# Patient Record
Sex: Male | Born: 1940 | Race: White | Hispanic: No | Marital: Married
Health system: Southern US, Academic
[De-identification: ages and names within clinical notes are randomized; demographics above are authoritative.]

---

## 2007-04-16 ENCOUNTER — Ambulatory Visit (INDEPENDENT_AMBULATORY_CARE_PROVIDER_SITE_OTHER): Payer: BC Managed Care – PPO | Admitting: NEUROSURGERY

## 2007-05-16 ENCOUNTER — Encounter (INDEPENDENT_AMBULATORY_CARE_PROVIDER_SITE_OTHER): Payer: BC Managed Care – PPO | Admitting: NEUROSURGERY

## 2007-05-18 ENCOUNTER — Encounter (INDEPENDENT_AMBULATORY_CARE_PROVIDER_SITE_OTHER): Payer: BC Managed Care – PPO | Admitting: NEUROSURGERY

## 2007-06-27 ENCOUNTER — Encounter (INDEPENDENT_AMBULATORY_CARE_PROVIDER_SITE_OTHER): Payer: BC Managed Care – PPO | Admitting: NEUROSURGERY

## 2007-06-29 ENCOUNTER — Encounter (INDEPENDENT_AMBULATORY_CARE_PROVIDER_SITE_OTHER): Payer: BC Managed Care – PPO | Admitting: NEUROSURGERY

## 2007-07-11 ENCOUNTER — Encounter (INDEPENDENT_AMBULATORY_CARE_PROVIDER_SITE_OTHER): Payer: BC Managed Care – PPO | Admitting: NEUROSURGERY

## 2011-04-27 ENCOUNTER — Ambulatory Visit (HOSPITAL_COMMUNITY): Payer: Self-pay | Admitting: FAMILY PRACTICE

## 2018-12-14 IMAGING — CT CT CHEST WITHOUT CONTRAST
2 of 3 series · 15 of 36 positions shown, 18 images · non-contrast
Comparison: There are no previous exams available for comparison.

CT CHEST WITHOUT CONTRAST, 12/14/2018 [DATE]: 
CLINICAL INDICATION: Dyspnea, coughing and shortness of breath x1 month 
A search for DICOM formatted images was conducted for prior CT imaging studies 
completed at a non-affiliated media free facility.
TECHNIQUE: The chest was scanned from base of neck through the lung bases 
without contrast on a high resolution low dose CT scanner.  Routine MPR and MIP 
3D renderings were reconstructed on an independent workstation with concurrent 
physician supervision.

[Series 3: chest 2.0 i31s 3 · axial · 0.88mm/px · z∈[-411,-67]mm · 12 of 202 slices shown, 15 images]
[im 15/202  mediastinal]
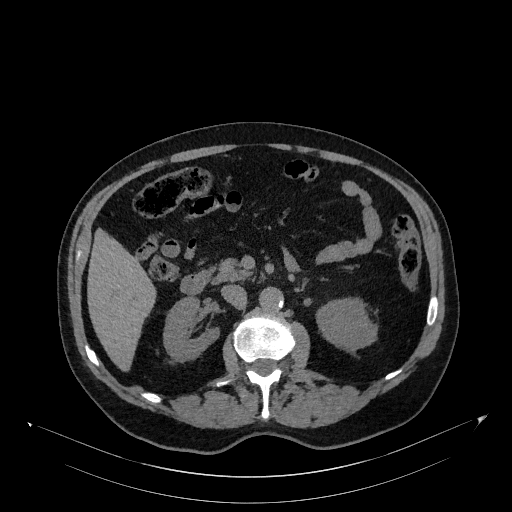
[im 15/202  lung]
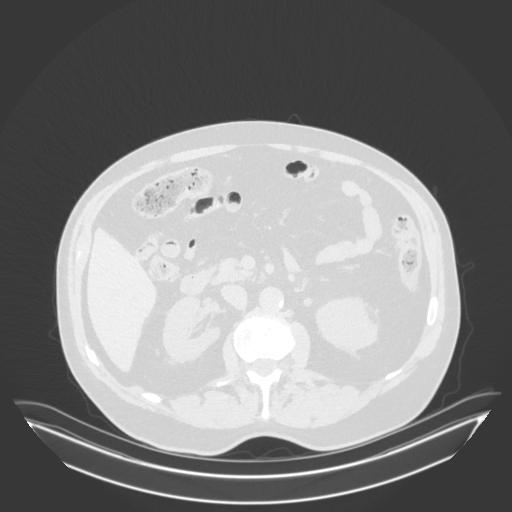
[im 30/202  lung]
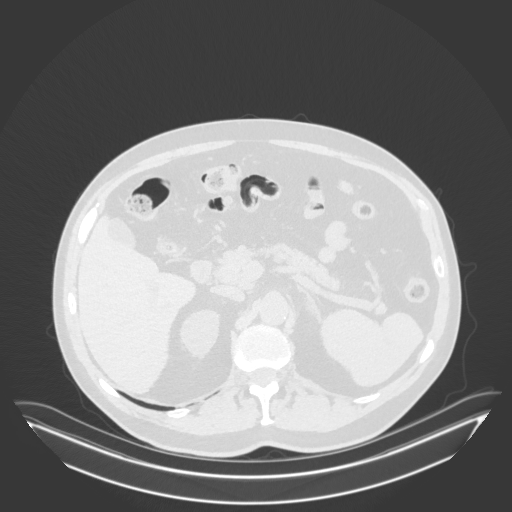
[im 45/202  lung]
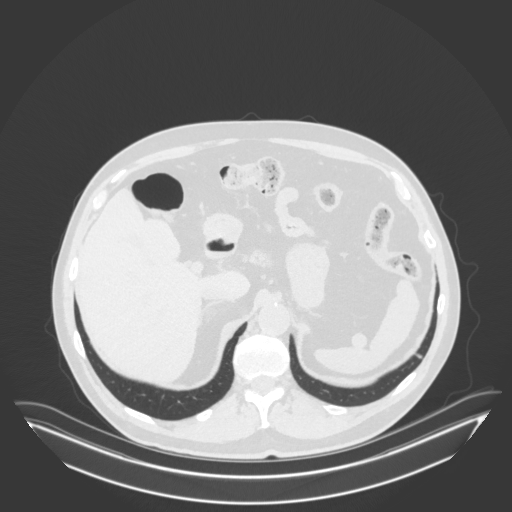
[im 60/202  lung]
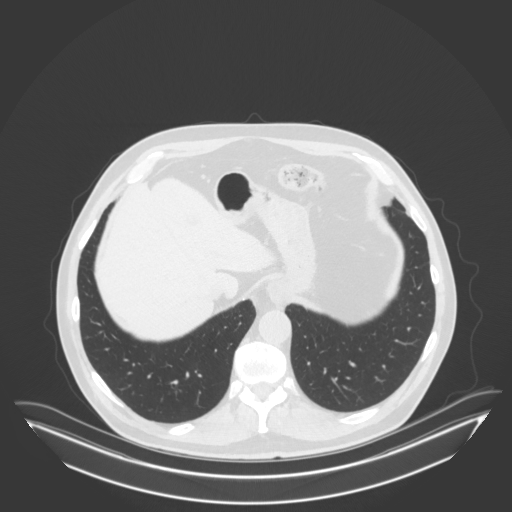
[im 75/202  mediastinal]
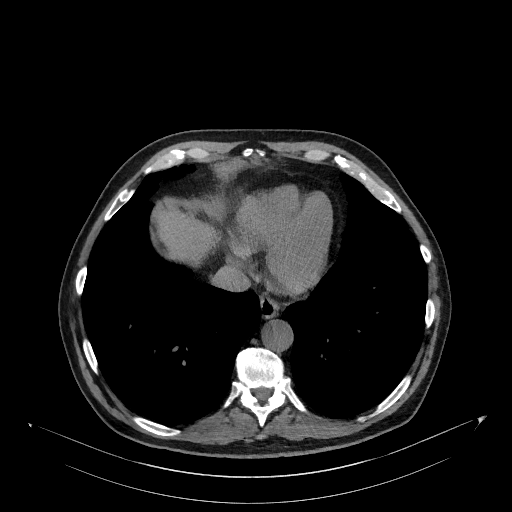
[im 75/202  lung]
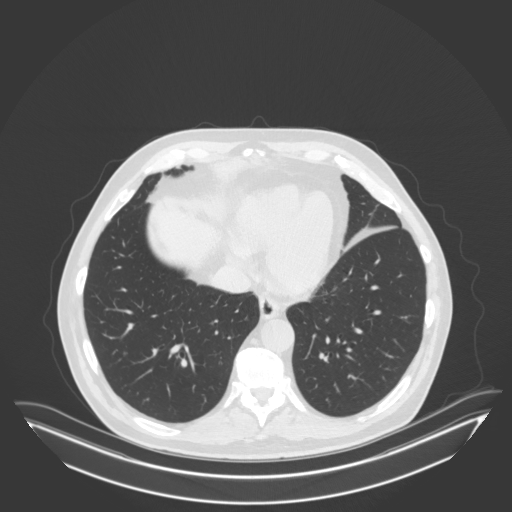
[im 90/202  lung]
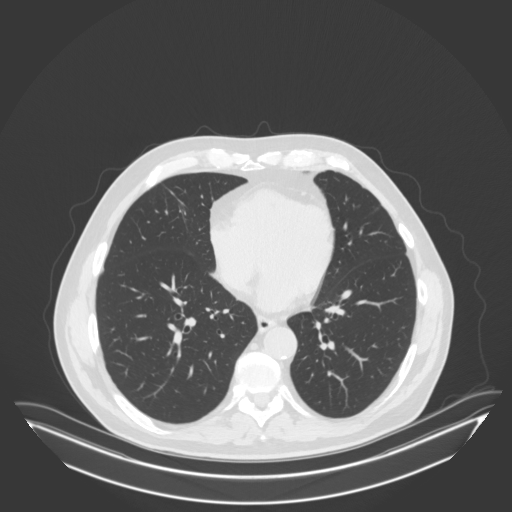
[im 112/202  lung]
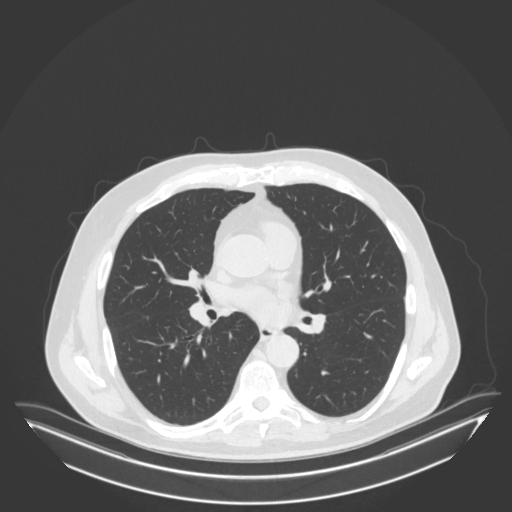
[im 127/202  lung]
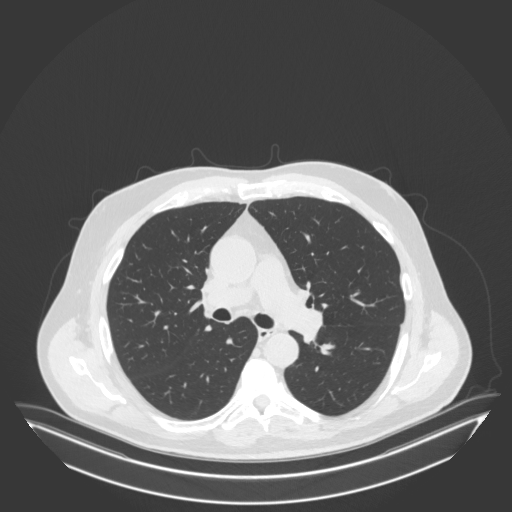
[im 142/202  mediastinal]
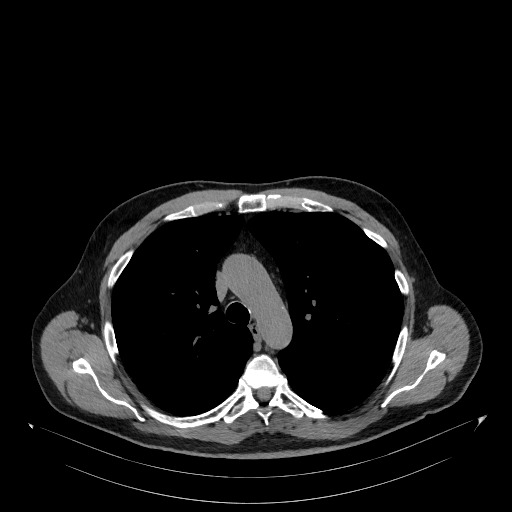
[im 142/202  lung]
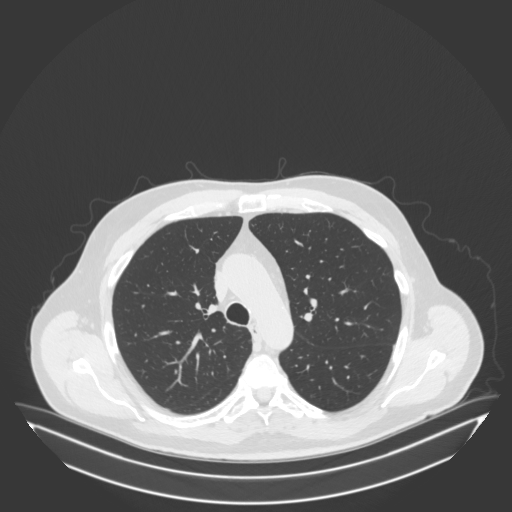
[im 157/202  lung]
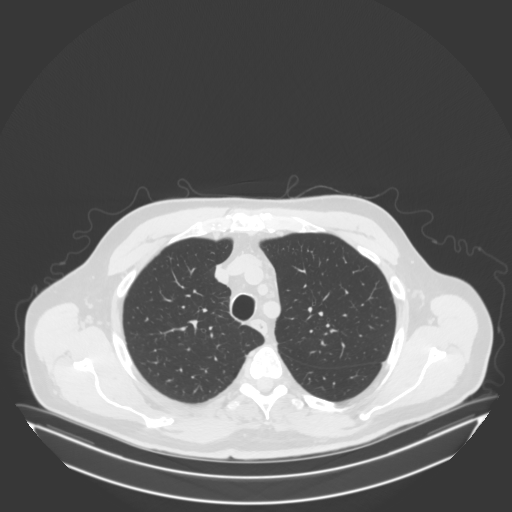
[im 172/202  lung]
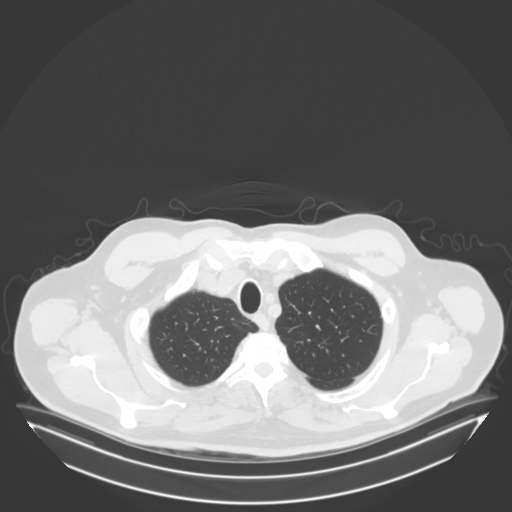
[im 187/202  lung]
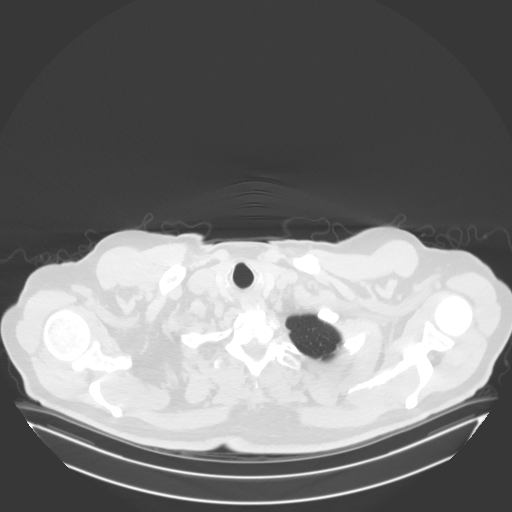

[Series 6: coronal · coronal · 0.76mm/px · 3 of 142 slices shown]
[im 29/142  lung]
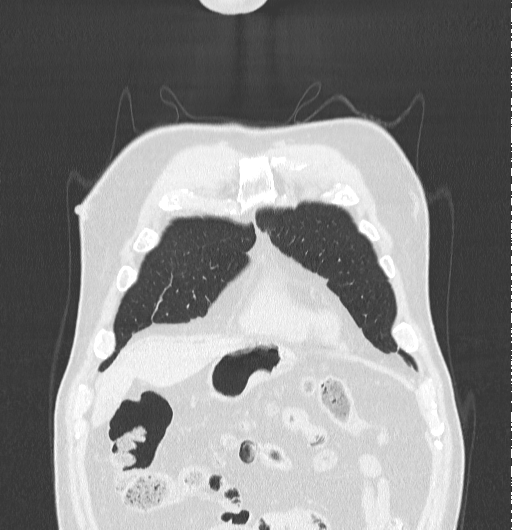
[im 57/142  lung]
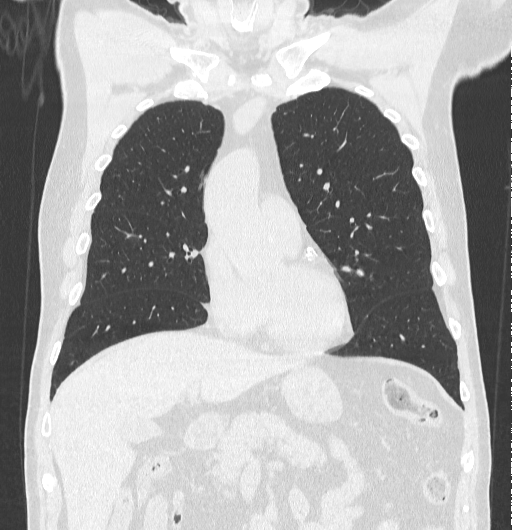
[im 85/142  lung]
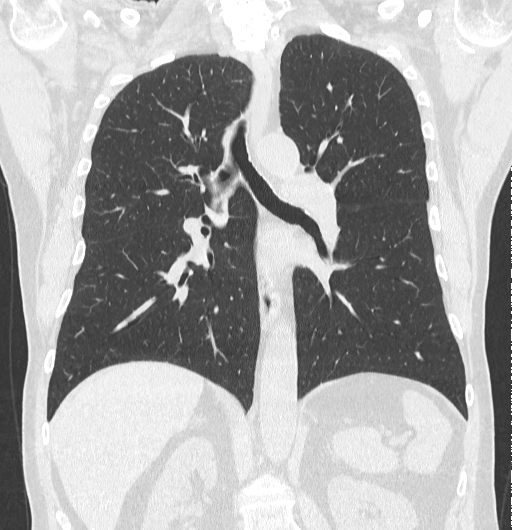

[15 of 36 positions shown; findings below may reference images not displayed]

FINDINGS: Lungs are well-expanded. There is no infiltrate or pleural effusion. 
There is a somewhat triangular 3 x 2 mm nodule in the right lower lobe, image 
119. There is a 1 mm nodule at the right base, image 124. A 1 mm pleural-based 
nodule is seen on image 145. A 2 mm nodule is seen in the lingula, image 107 
Thyroid lobes are unremarkable. There is no mediastinal adenopathy. The main 
pulmonary artery is nondilated. There is no axillary adenopathy. The heart is 
not enlarged. There are moderate coronary artery calcifications. There is a 
small hiatal hernia. No adrenal nodule. There are calcified splenic granulomas. 
Liver is unremarkable. There are mild thoracic spine degenerative changes.
IMPRESSION: Micronodules at the lung bases as described, the largest triangular, 3 x 2 mm in 
the right lower lobe. 
No adenopathy, infiltrate or pleural effusion. 
Small hiatal hernia, potentially associated with GERD. 
REFERENCE: 
Recommendations for pulmonary nodule follow-up according to [HOSPITAL] 
Guidelines. 
Multiple nodules size: < 6mm 
*Low risk patients: no routine follow-up. 
*High risk patients: optional CT at 12 months. 
Multiple nodules size: 6-8mm 
*Low risk patients: follow-up at 3-6 months, then consider further follow-up at 
18-24 months. 
*High risk patients: follow-up at 3-6 months, then at 18-24 months if no change. 
Multiple nodules size: > 8mm 
*Low risk patients: follow-up at 3-6 months, then consider further follow-up at 
18-24 months. 
*High risk patients: follow-up at 3-6 months, then at 18-24 months if no change. 
NOTE: 
Low risk patients: minimal or absent history of smoking and or other known risk 
factors. 
High risk patients: history of smoking or of other known risk factors (e.g. 
first degree relative with lung cancer, or exposure to asbestos, radon uranium) 
If a nodule up to 8mm is partly solid or is ground glass further follow up is 
required after 24 months to exclude possible slow growing adenocarcinoma (GUIN) 
Size is average of length and width. 
RADIATION DOSE REDUCTION: All CT scans are performed using radiation dose 
reduction techniques, when applicable.  Technical factors are evaluated and 
adjusted to ensure appropriate moderation of exposure.  Automated dose 
management technology is applied to adjust the radiation doses to minimize 
exposure while achieving diagnostic quality images.

## 2020-11-12 IMAGING — MR MRI LUMBAR SPINE WITHOUT CONTRAST
4 of 6 series · 22 of 48 positions shown · IV contrast (gadolinium)
Comparison: None

MRI LUMBAR SPINE WITHOUT CONTRAST, 11/12/2020 [DATE]: 
CLINICAL INDICATION: Stenosis. Low back pain for 20 years. Pain radiates into 
the thighs.
TECHNIQUE: Multiplanar, multiecho position MR images of the lumbar spine were 
performed without intravenous gadolinium enhancement.

[Series 101: survey · axial · 10.0mm · 1.39mm/px · z∈[-15,+199]mm · 3 of 14 slices shown]
[im 1/14]
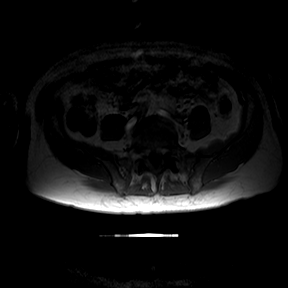
[im 9/14]
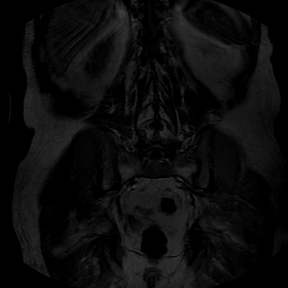
[im 14/14]
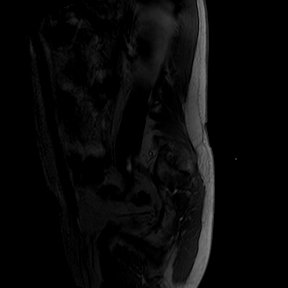

[Series 301: t1w tse sag · sagittal · 4.5mm · 0.50mm/px · 3 of 17 slices shown]
[im 4/17]
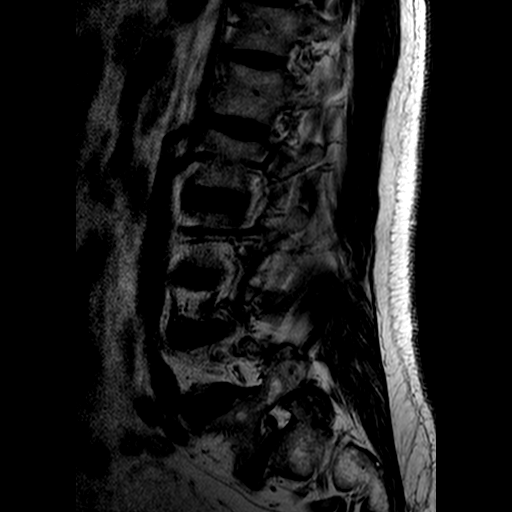
[im 10/17]
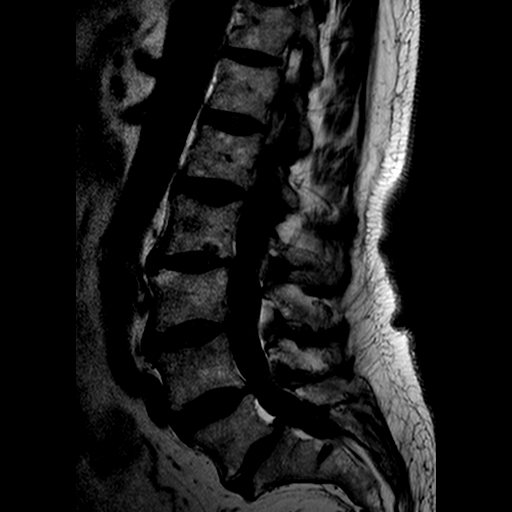
[im 17/17]
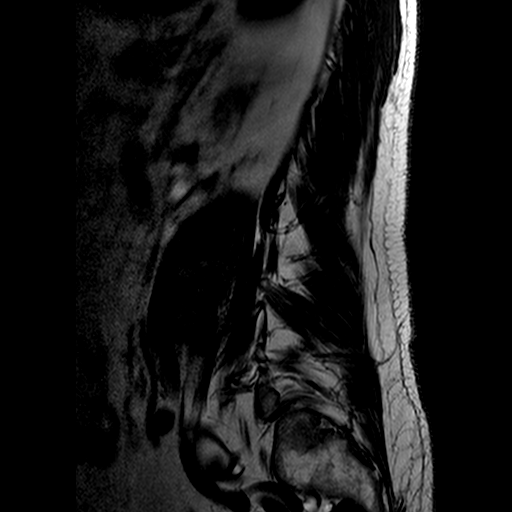

[Series 601: T2 · oblique · 4.0mm · 0.47mm/px · 11 of 30 slices shown]
[im 1/30]
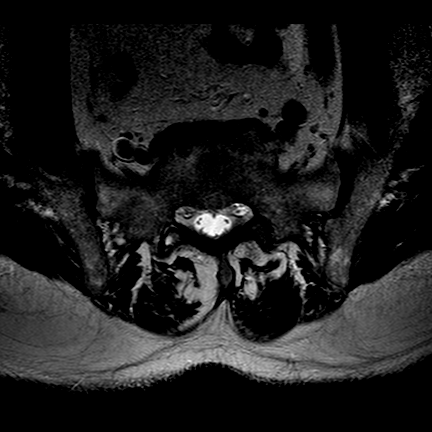
[im 3/30]
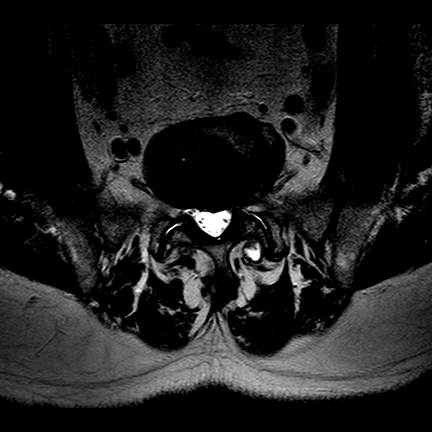
[im 6/30]
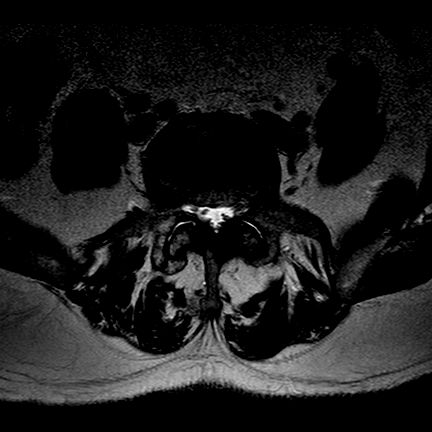
[im 9/30]
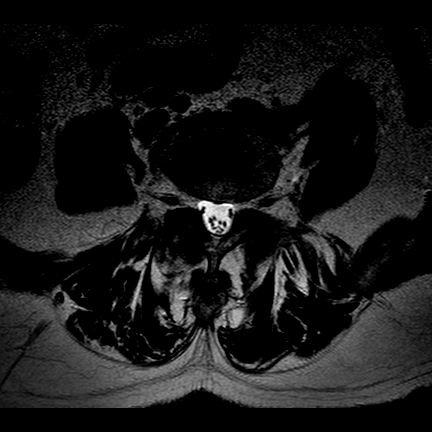
[im 12/30]
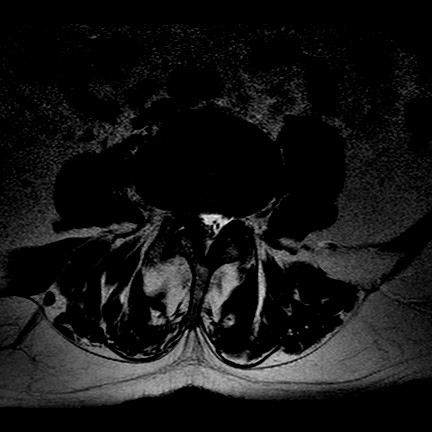
[im 15/30]
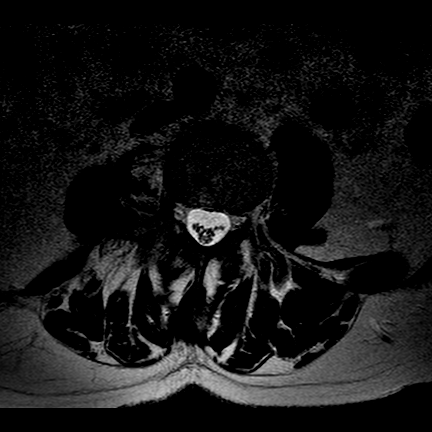
[im 18/30]
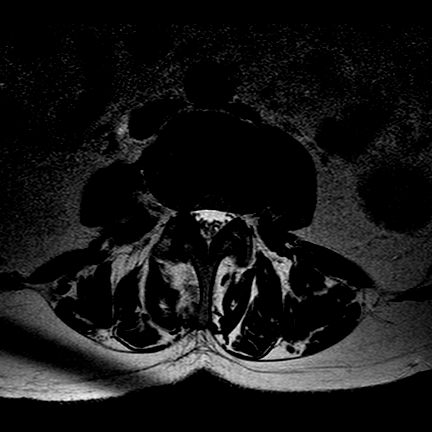
[im 21/30]
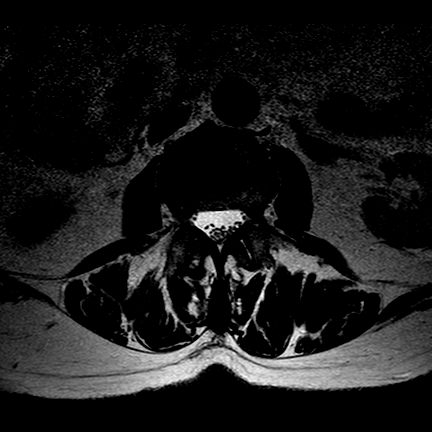
[im 24/30]
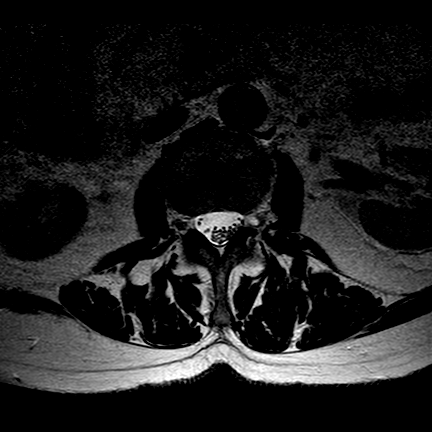
[im 27/30]
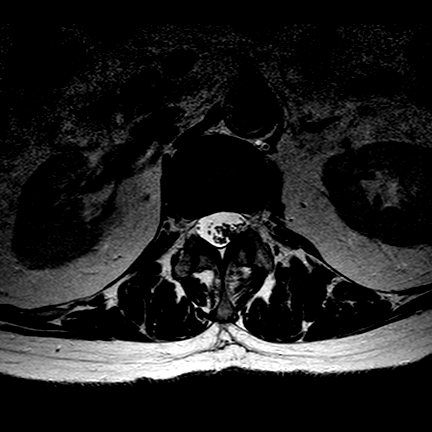
[im 30/30]
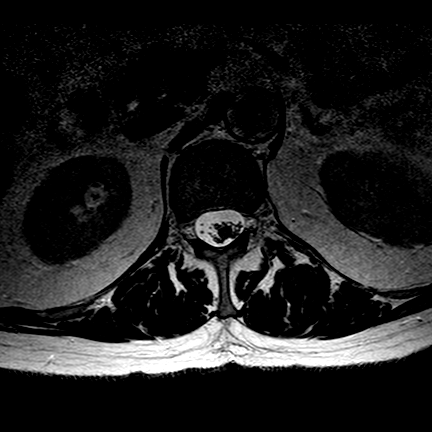

[Series 701: T1 · oblique · 4.0mm · 0.35mm/px · 5 of 42 slices shown]
[im 1/42]
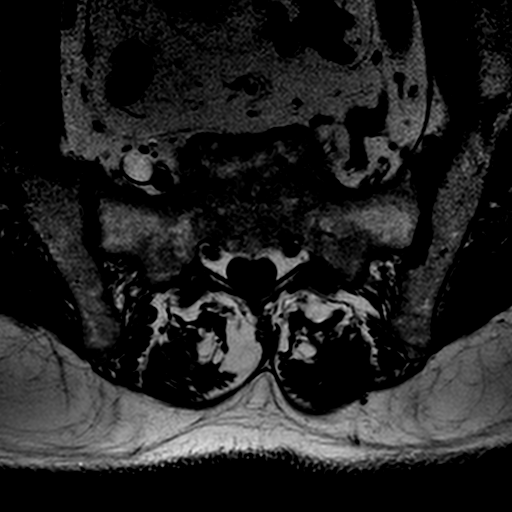
[im 6/42]
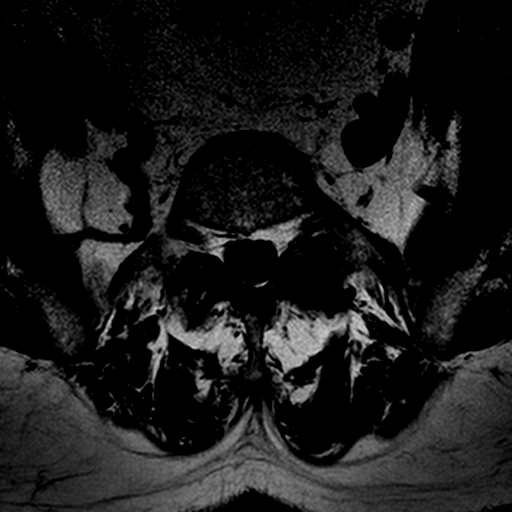
[im 12/42]
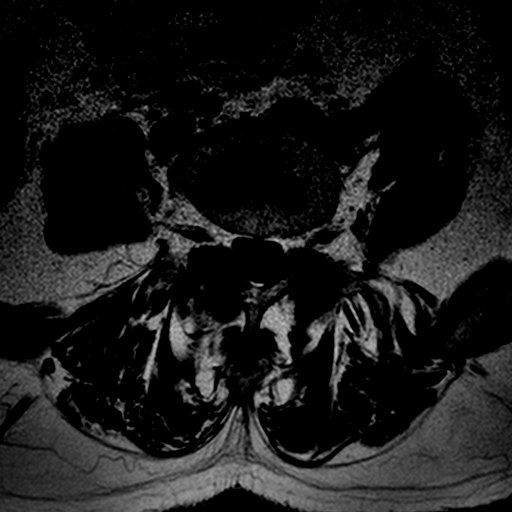
[im 21/42]
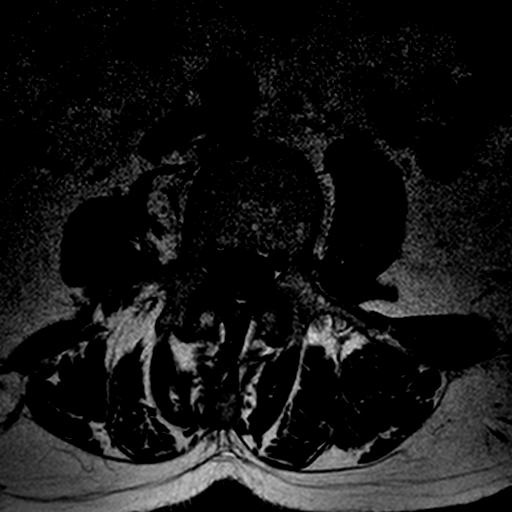
[im 36/42]
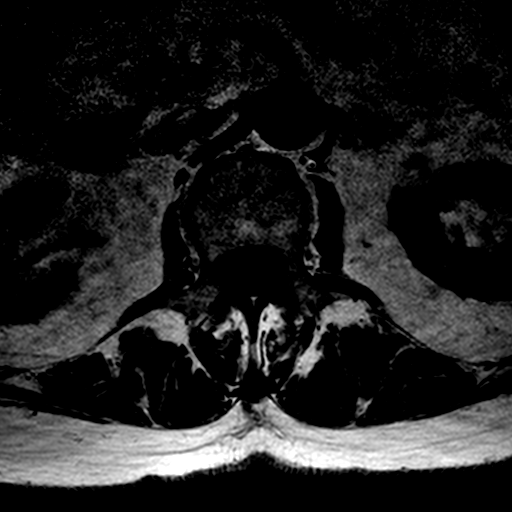

[22 of 48 positions shown; findings below may reference images not displayed]

FINDINGS: Nomenclature is based on 5 lumbar type vertebral bodies.  The 
vertebral bodies are normal in height and alignment. No acute vertebral body 
fracture. Grade 1 anterolisthesis of L5 upon S1. No identifiable pars 
interarticularis defects. Multiple Schmorl's nodes. 17 degrees levoscoliosis. 
Type I Modic changes at L3-4. No abnormal marrow signal intensity. No pars 
defect.  The conus tip terminates at the L1 vertebral body level. The aorta is 
normal in diameter. The posterior paraspinal soft tissues are negative. 
T12-L1: The disc is normal in height and signal. No disc herniation. Normal 
facets. No spinal canal or neural foraminal stenosis. 
L1-L2: The disc is normal in height and signal. No disc herniation. Normal 
facets. No spinal canal or neural foraminal stenosis. 
L2-L3: Broad-based disc bulge. Mild disc space narrowing and disc desiccation. 
No disc herniation. Mild facet arthropathy. Ligamentum flavum hypertrophy. Mild 
central canal stenosis. Moderate lateral recess stenosis with potential 
encroachment on the right L3 nerve root. No neural foraminal stenosis. 
L3-L4: Broad-based disc bulge. Mild disc space narrowing and disc desiccation. 
No disc herniation. Mild facet arthropathy. Ligamentum flavum hypertrophy. Mild 
central canal stenosis. Marked right and moderate left lateral recess stenosis 
with encroachment on the right L4 nerve root. No neural foraminal stenosis. 
L4-L5: Broad-based disc bulge. The disc is normal in height with disc 
desiccation. No disc herniation. Marked facet arthropathy and ligamentum flavum 
hypertrophy. Moderate central canal/lateral recess stenosis, with potential 
encroachment on the bilateral L5 nerve roots. No neural foraminal stenosis. 
L5-S1: Grade 1 anterolisthesis. Broad-based disc bulge. Marked disc space 
narrowing and vacuum phenomenon. No disc herniation. Marked left facet 
arthropathy with 1 cm posterior left synovial cyst. Moderate right facet 
arthropathy. Ligamentum flavum hypertrophy. No central canal stenosis. Marked 
left lateral recess stenosis and encroachment on the left S1 nerve root. Mild 
left neural foraminal stenosis.
IMPRESSION: 1.  Multifocal degenerative change, mild levoscoliosis and multilevel spinal 
stenosis. 
2.  L2-3 mild central canal stenosis and moderate lateral recess stenosis (with 
potential encroachment on the right L3 nerve root). 
3.  L3-4 mild central canal stenosis, marked right/moderate left lateral recess 
stenosis (with encroachment on the right L4 nerve root). 
4.  L4-5 moderate central canal/lateral recess stenosis (with potential 
encroachment on the bilateral L5 nerve roots). 
5.  L5-S1 grade 1 anterolisthesis, marked left lateral recess stenosis (with 
encroachment on the left S1 nerve root) and mild left neural foraminal stenosis.

## 2020-12-15 IMAGING — CT CT ABDOMEN AND PELVIS WITH CONTRAST
2 series · 9 of 42 positions shown, 10 images · IV contrast (Iodine)
Comparison: None.

CT ABDOMEN AND PELVIS WITH CONTRAST, 12/15/2020 [DATE]: 
CLINICAL INDICATION:  Abdominal pain. 
A search for DICOM formatted images was conducted for prior CT imaging studies 
completed at a non-affiliated media free facility.
TECHNIQUE: The abdomen and pelvis were scanned from lung bases through the 
pubic rami without and with 100 mL of Isovue 300 contrast injected intravenously 
on a high-resolution CT scanner using dose reduction techniques. Routine MPR 
reconstructions were performed. The patient's eGFR was calculated to be
using the i-STAT device.

[Series 401: axial w, idose (3) · axial · 0.82mm/px · z∈[+675,+1059]mm · 6 of 160 slices shown, 7 images]
[im 16/160  soft-tissue]
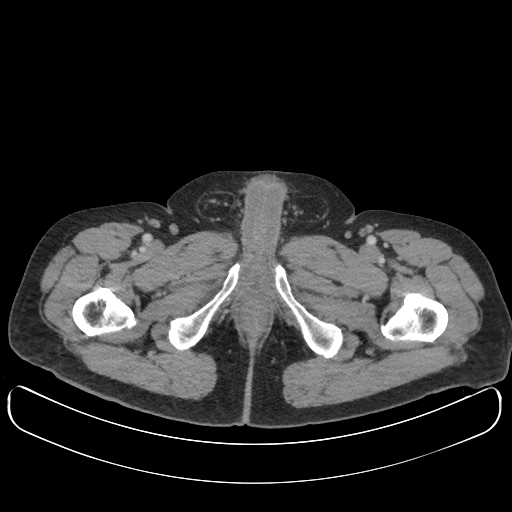
[im 16/160  bone]
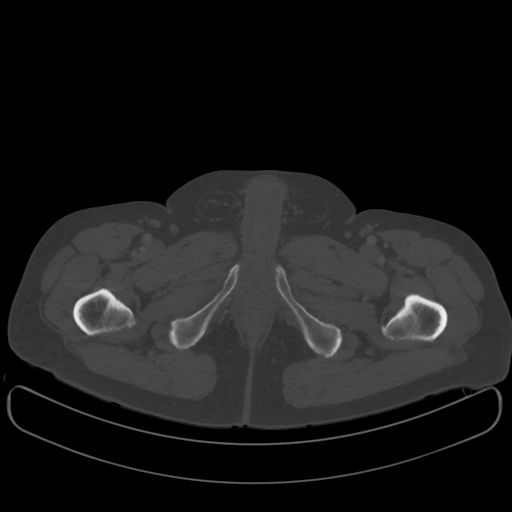
[im 42/160  soft-tissue]
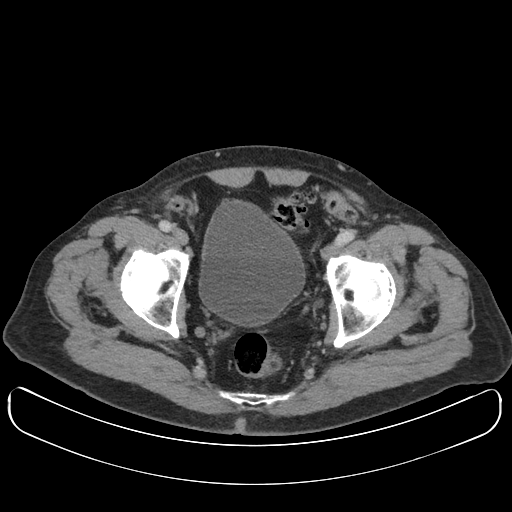
[im 67/160  soft-tissue]
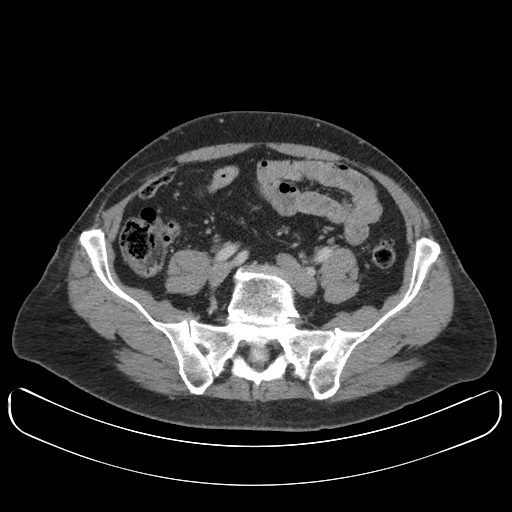
[im 93/160  soft-tissue]
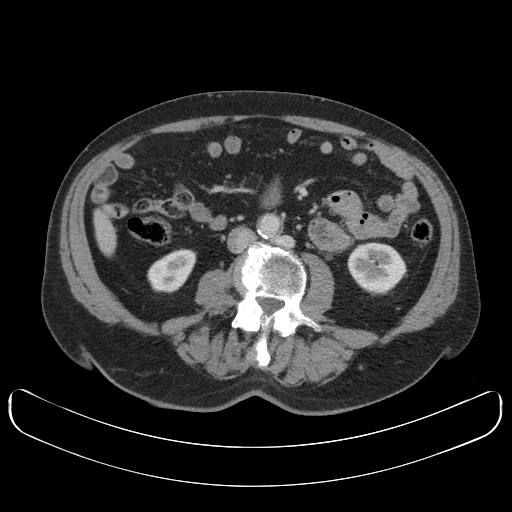
[im 118/160  soft-tissue]
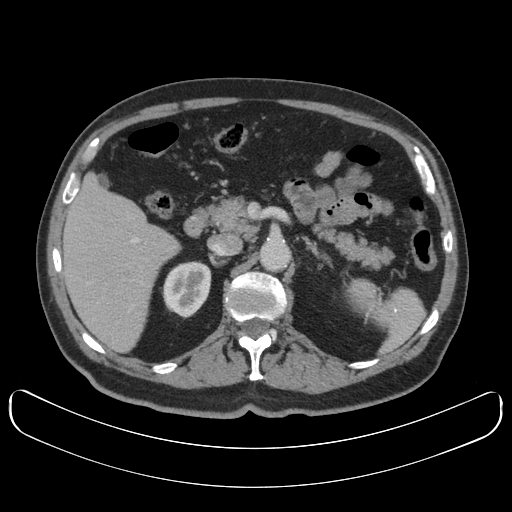
[im 144/160  soft-tissue]
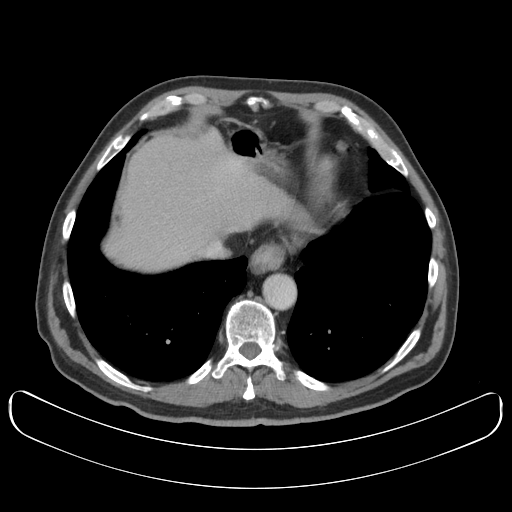

[Series 402: coronal w, idose (3) · coronal · 0.45mm/px · 3 of 116 slices shown]
[im 39/116  soft-tissue]
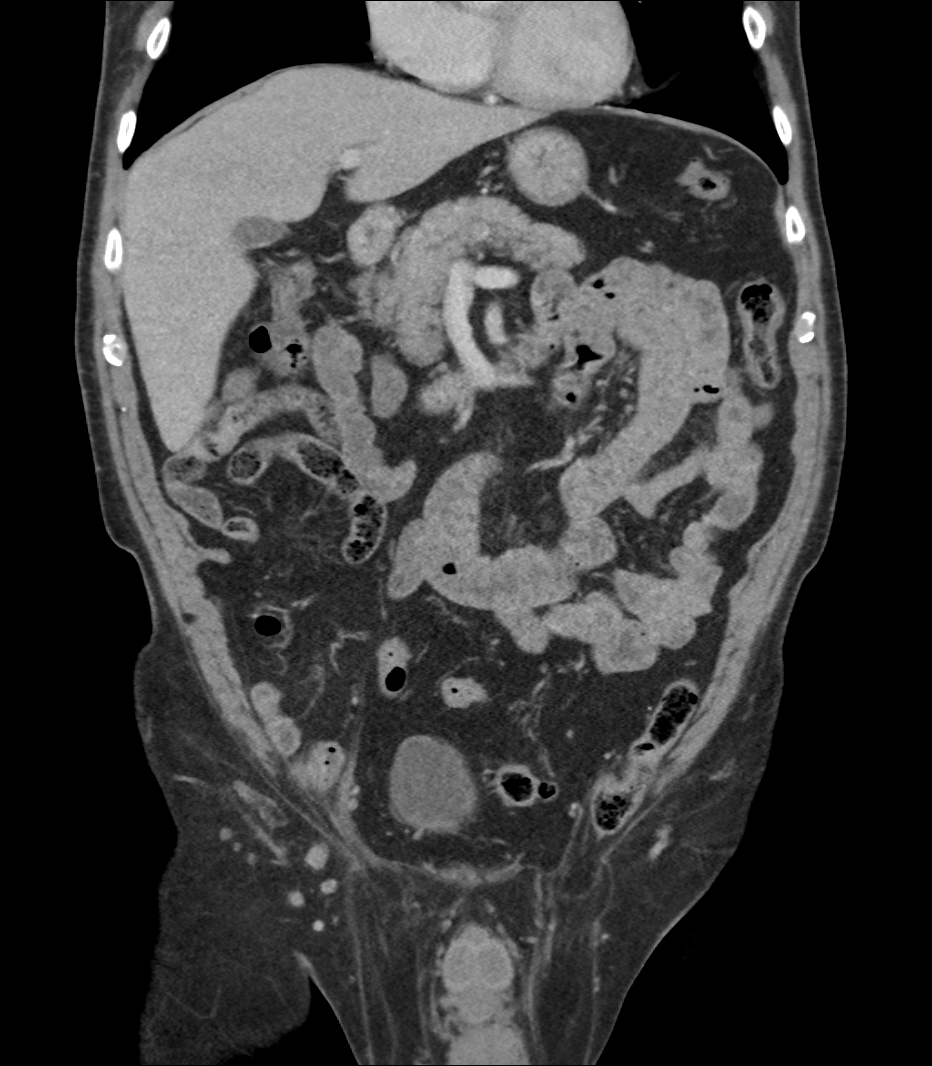
[im 52/116  soft-tissue]
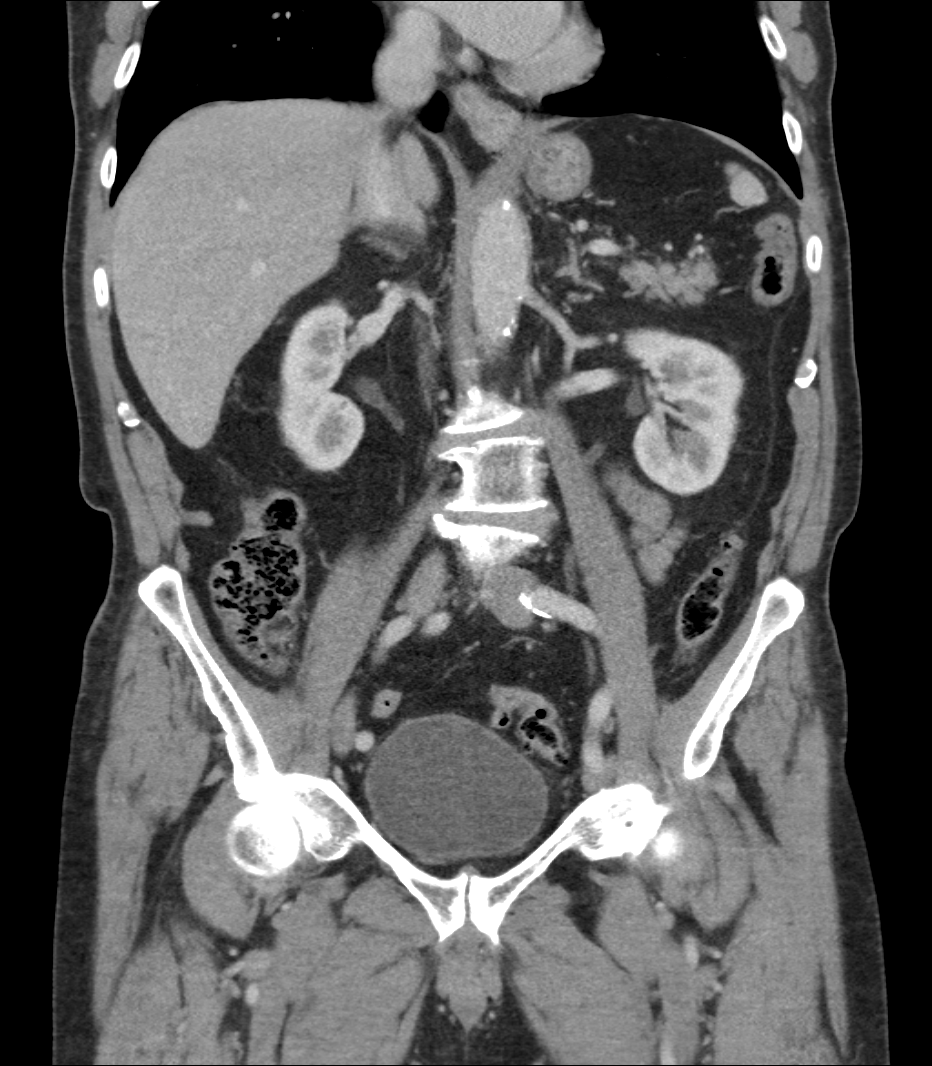
[im 64/116  soft-tissue]
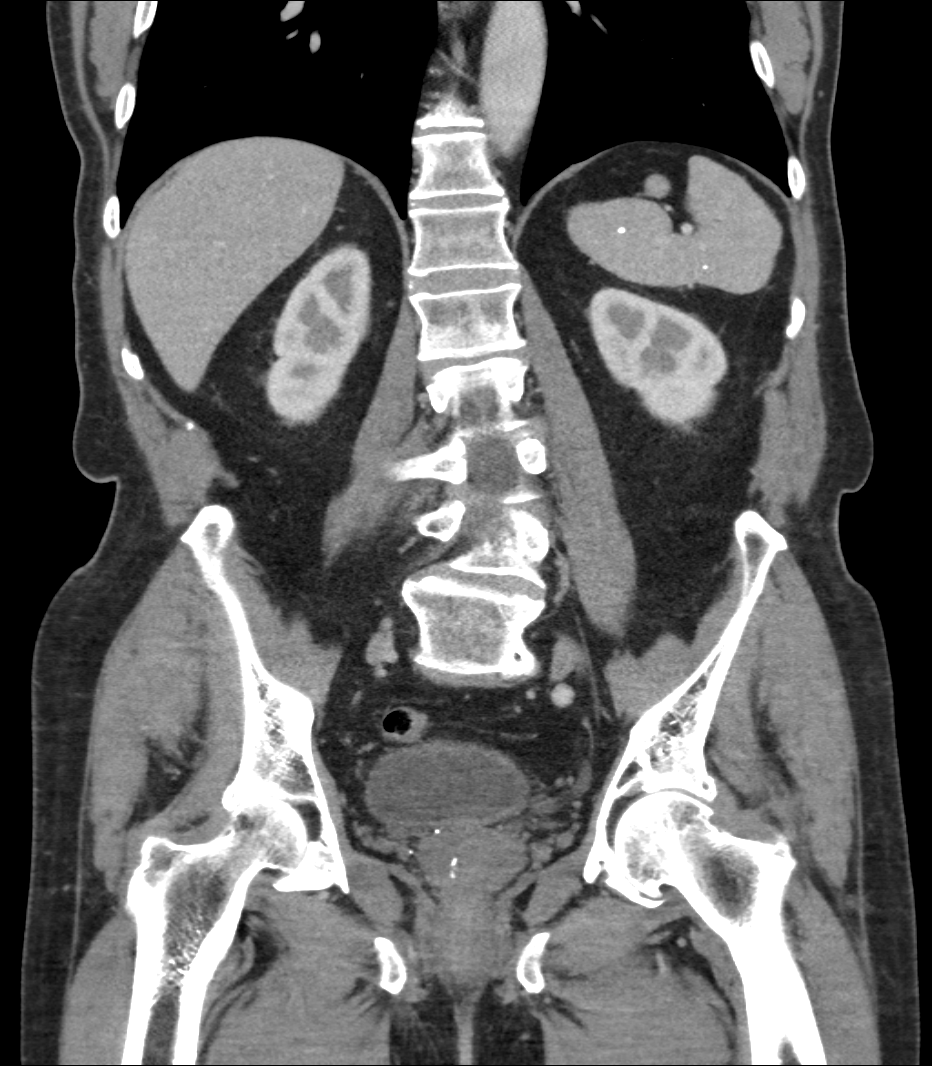

[9 of 42 positions shown; findings below may reference images not displayed]

FINDINGS: There are small bilateral inguinal hernias consisting of a small loop of sigmoid 
colon extending into the LEFT inguinal canal and a small loop of distal small 
bowel extending into the opening of the RIGHT inguinal canal. No secondary 
complications identified at either site. There is a moderate degree of 
diverticulosis in the distal colon. There is no evidence for diverticulitis or 
other acute colonic pathology. Colonic stool is not increased. An appendiceal 
stump is present. Patient has had appendectomy. No small bowel pathology is 
detected. There is a minimal sliding-type hiatal hernia. The stomach is 
otherwise unremarkable. The small bowel mesentery appears normal. The main trunk 
of the superior mesenteric vein and portal vein are patent. No hepatic pathology 
is found. The gallbladder and biliary tract are unremarkable. The pancreas 
appears normal. The spleen is normal except for several punctate granulomatous 
calcifications which are of no clinical significance. Both of the adrenal glands 
are within normal limits. No acute renal pathology is found. There is an 
incidental nonobstructing 3 mm calculus in one of the mid pole calyces of the 
LEFT kidney. Urinary bladder is within normal limits. Prostate is minimally 
enlarged. Dystrophic prostatic calcifications and calcified pelvic venous 
phleboliths are incidentally noted. Abdominal aorta is normal in caliber. Mild 
atherosclerotic calcifications are present. No abnormal abdominal or pelvic 
lymph nodes are detected. There is no ascites. Degenerative changes are in the 
spine with at least moderate central canal stenosis at L4-5. Limited imaging of 
the inferior thorax shows no significant incidental findings.
IMPRESSION: 1. No acute appearing abdominal or pelvic pathology is found. 
2. There are small bilateral inguinal hernias containing loops of bowel without 
evidence for secondary complication. Moderate chronic diverticulosis of the 
colon is noted. 
3. Minimal sliding-type hiatal hernia. 
4. Incidental nonobstructing 3 mm LEFT renal calculus 
RADIATION DOSE REDUCTION: All CT scans are performed using radiation dose 
reduction techniques, when applicable.  Technical factors are evaluated and 
adjusted to ensure appropriate moderation of exposure.  Automated dose 
management technology is applied to adjust the radiation doses to minimize 
exposure while achieving diagnostic quality images.

## 2021-04-12 IMAGING — CT CT CHEST WITH CONTRAST
2 of 3 series · 15 of 36 positions shown, 18 images · IV contrast (ISOVUE 300)
Comparison: 12/14/2018

FINAL Diagnostic Imaging Report 
________________________________________________________________________________________________ 
CT CHEST WITH CONTRAST, 04/12/2021 [DATE]: 
CLINICAL INDICATION: Follow-up lung nodule. 
A search for DICOM formatted images was conducted for prior CT imaging studies 
completed at a non-affiliated media free facility.
TECHNIQUE: The chest was scanned from base of neck through the lung bases with 
100 mL of Isovue 300 injected intravenously on a high resolution low dose CT 
scanner. Routine MPR and MIP 3D renderings were reconstructed on an independent 
workstation with concurrent physician supervision.

[Series 4: chest 2.0 i31s 3 · axial · 0.85mm/px · z∈[-383,-37]mm · 12 of 203 slices shown, 15 images]
[im 15/203  mediastinal]
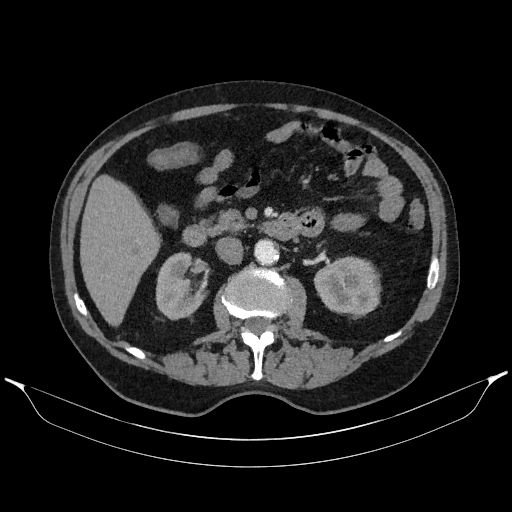
[im 15/203  lung]
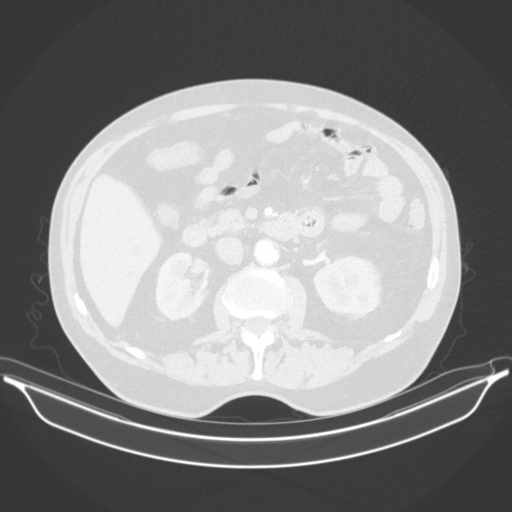
[im 30/203  lung]
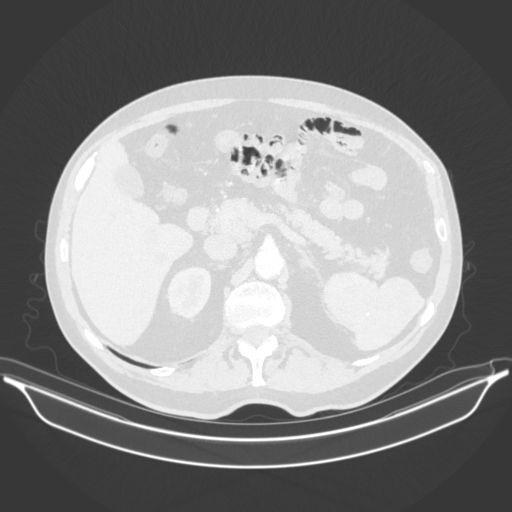
[im 45/203  lung]
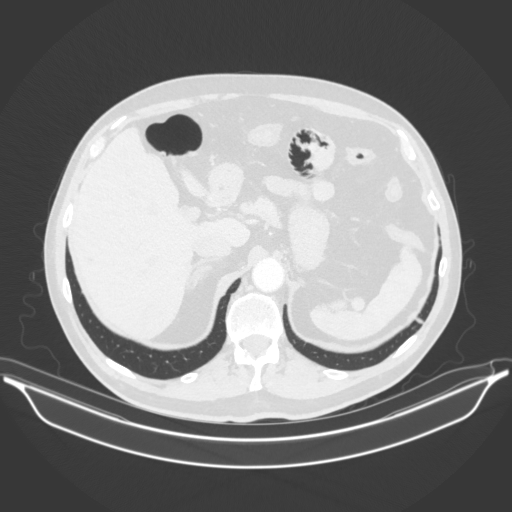
[im 60/203  lung]
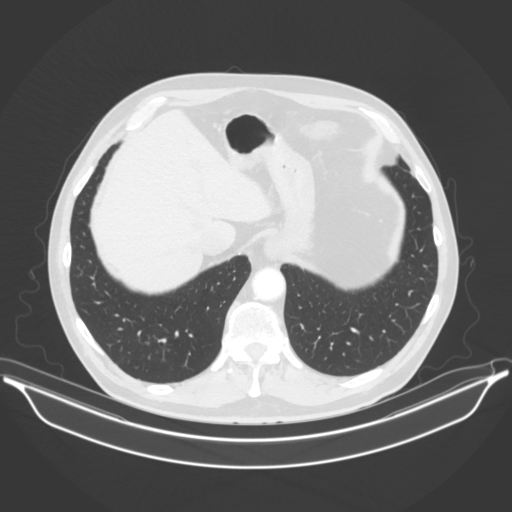
[im 75/203  mediastinal]
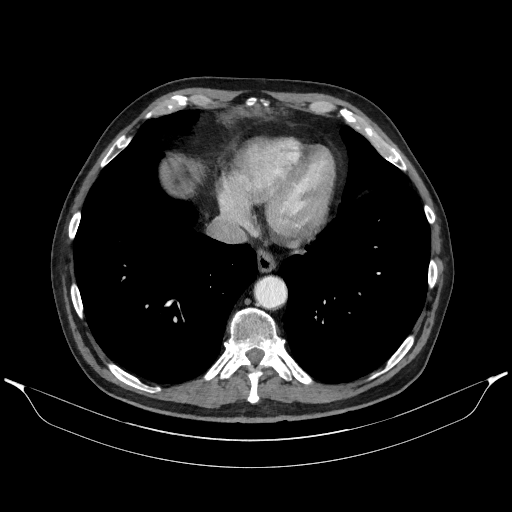
[im 75/203  lung]
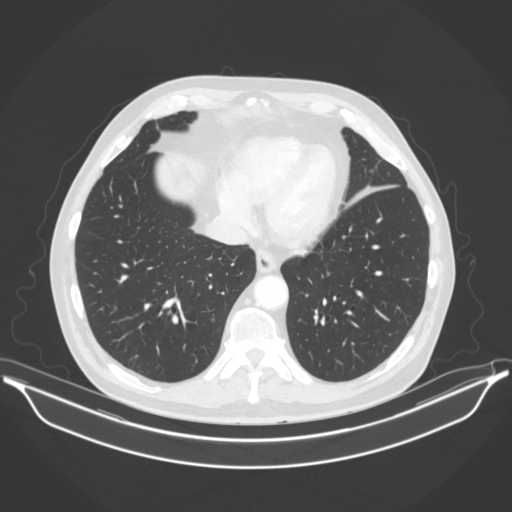
[im 90/203  lung]
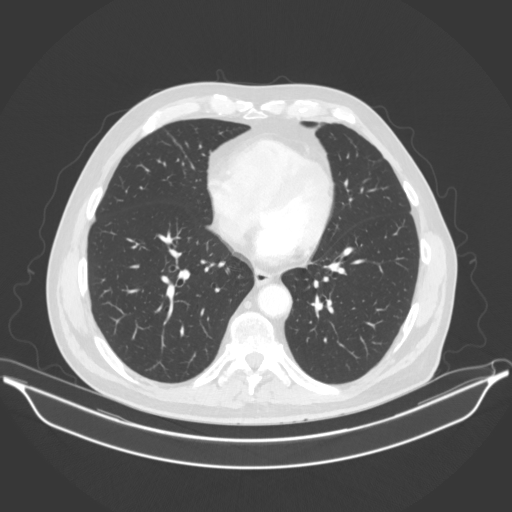
[im 113/203  lung]
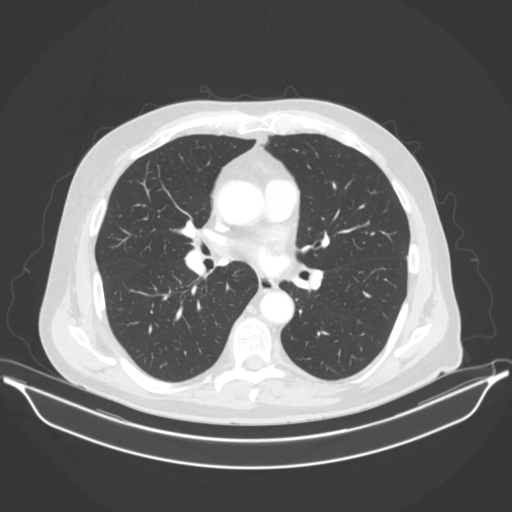
[im 128/203  lung]
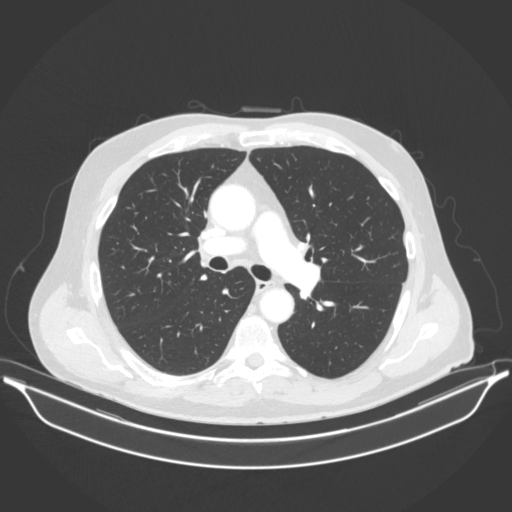
[im 143/203  mediastinal]
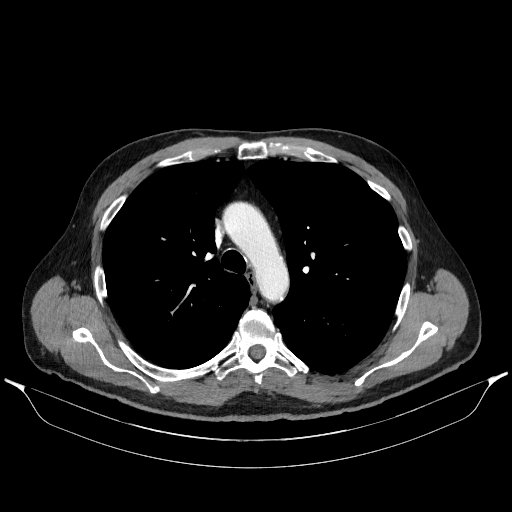
[im 143/203  lung]
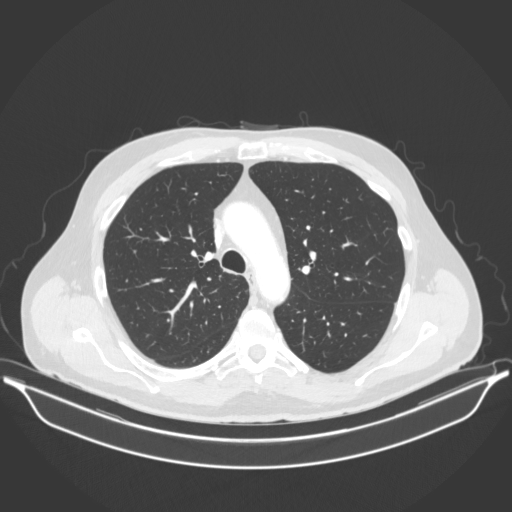
[im 158/203  lung]
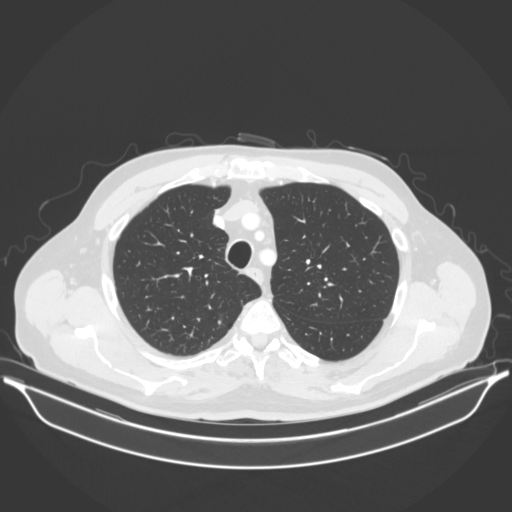
[im 173/203  lung]
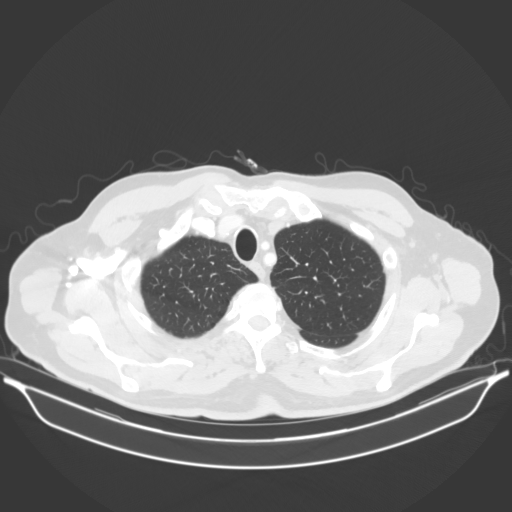
[im 188/203  lung]
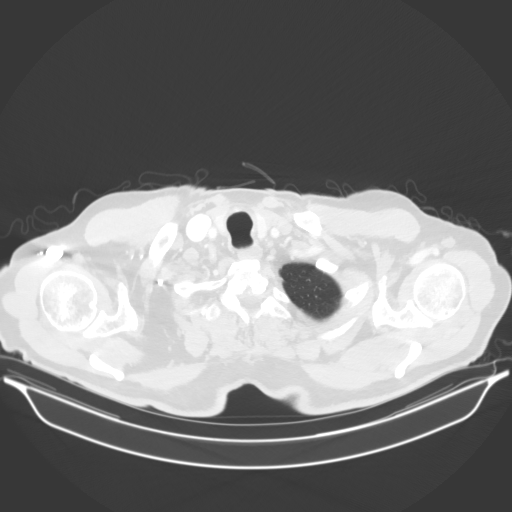

[Series 7: coronal · coronal · 0.79mm/px · 3 of 144 slices shown]
[im 29/144  lung]
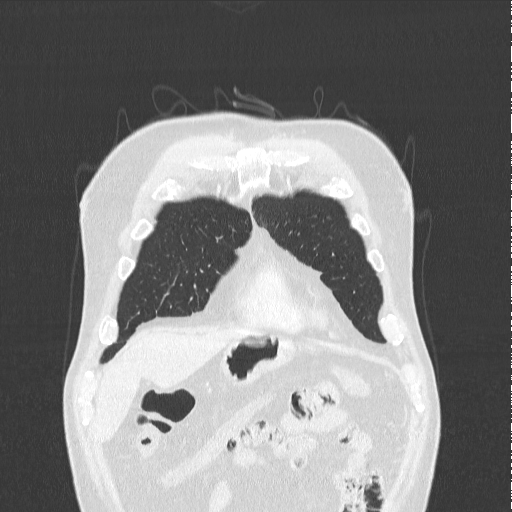
[im 58/144  lung]
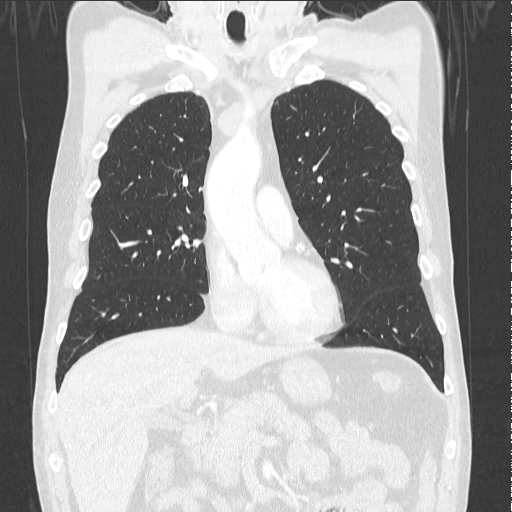
[im 86/144  lung]
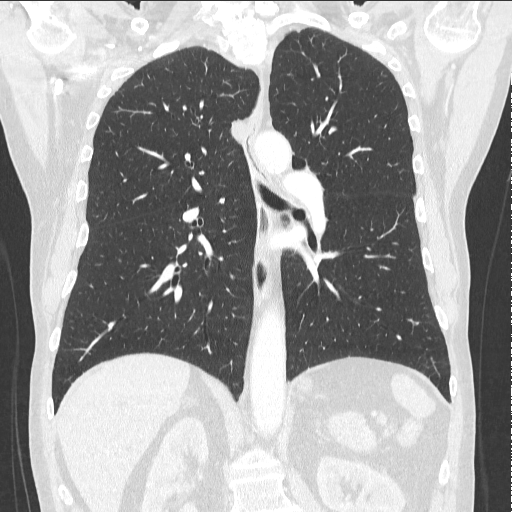

[15 of 36 positions shown; findings below may reference images not displayed]

FINDINGS: LUNGS AND PLEURA:  The lungs are clear. No pleural effusion.  2 mm nodule in the 
lingula on axial image 109. No further evaluation necessary. Calcified granuloma 
RIGHT lower lobe. 
MEDIASTINUM:  No adenopathy. Normal heart size. No pericardial effusion. 
Extensive calcification LAD coronary artery. No pulmonary emboli. 
CHEST WALL/AXILLA: No mass or adenopathy.  Normal thyroid. 
UPPER ABDOMEN: Small hiatal hernia. 2 mm calculus upper pole LEFT kidney. 
Splenic calcifications. Calcified aorta is not dilated. 
MUSCULOSKELETAL: No acute abnormality.
IMPRESSION: 2 mm nodule in the lingula, and no further investigation is necessary. Reference 
below. 
Extensive calcification LAD coronary artery. Cardiac consultation recommended. 
Small hiatal hernia. 
2 mm calculus upper pole LEFT kidney. 
REFERENCE: Recommendations for pulmonary nodule follow-up according to 
[HOSPITAL] Guidelines. 
Solitary pure ground-glass nodule 
Nodule size < 6mm 
*No ct follow-up is required 
Nodule size >/= 6mm 
*Follow-up CT at 6-12 months, then every 2 years until 5 years 
Solitary part-solid nodule 
Nodule size < 6mm 
*No ct follow-up is required 
Nodule size >/= 6mm 
*Follow-up CT at 3-6 months 
*If unchanged, and solid component remains < 6mm, then annual follow-up for 5 
years. 
Multiple nodules size: < 6mm 
*Low risk patients: no routine follow-up. 
*High risk patients: optional CT at 12 months. 
Multiple nodules size: 6-8mm 
*Low risk patients: follow-up at 3-6 months, then consider further follow-up at 
18-24 months. 
*High risk patients: follow-up at 3-6 months, then at 18-24 months if no change. 
Multiple nodules size: > 8mm 
*Low risk patients: follow-up at 3-6 months, then consider further follow-up at 
18-24 months. 
*High risk patients: follow-up at 3-6 months, then at 18-24 months if no change. 
Size is average of length and width. 
RADIATION DOSE REDUCTION: All CT scans are performed using radiation dose 
reduction techniques, when applicable.  Technical factors are evaluated and 
adjusted to ensure appropriate moderation of exposure.  Automated dose 
management technology is applied to adjust the radiation doses to minimize 
exposure while achieving diagnostic quality images.

## 2022-08-05 IMAGING — CT CT CHEST WITH CONTRAST
2 of 4 series · 15 of 36 positions shown, 18 images · IV contrast (ISOVUE 300)
Comparison: Chest CT from April 12, 2021.

________________________________________________________________________________________________ 
CT CHEST WITH CONTRAST, 08/05/2022 [DATE]: 
CLINICAL INDICATION: Pulmonary nodules 
A search for DICOM formatted images was conducted for prior CT imaging studies 
completed at a non-affiliated media free facility.
TECHNIQUE: The chest was scanned from base of neck through the lung bases with 
100 mL of  Isovue 300 MDV injected intravenously on a high resolution low dose 
CT scanner. 0 mL of Isovue 300 MDV were discarded. Routine MPR and MIP 3D 
renderings were performed with concurrent physician supervision.

[Series 4: chest 2.0 i31s 3 · axial · 0.83mm/px · z∈[-377,-29]mm · 12 of 192 slices shown, 15 images]
[im 9/192  mediastinal]
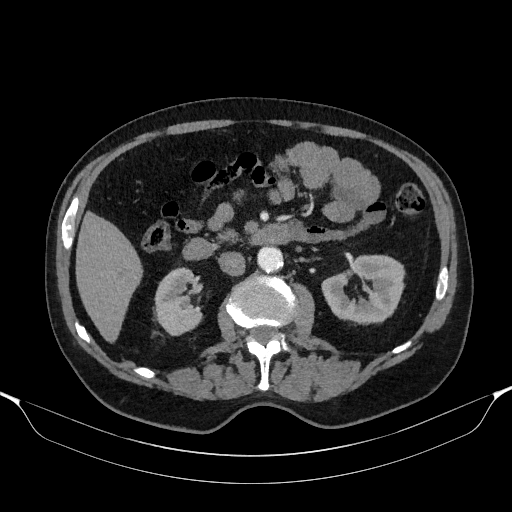
[im 9/192  lung]
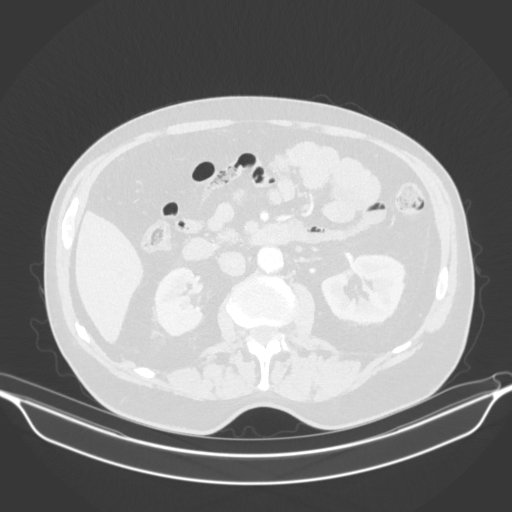
[im 27/192  lung]
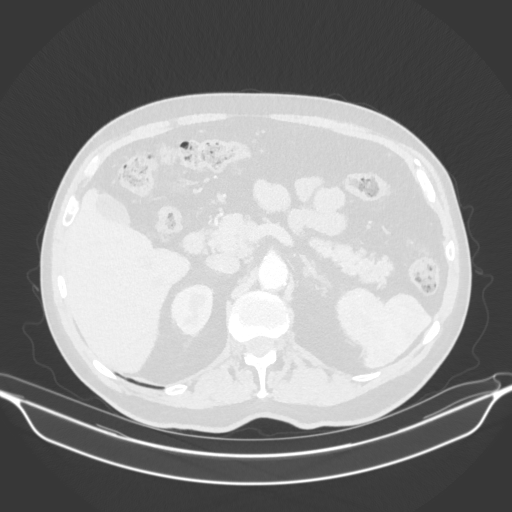
[im 44/192  lung]
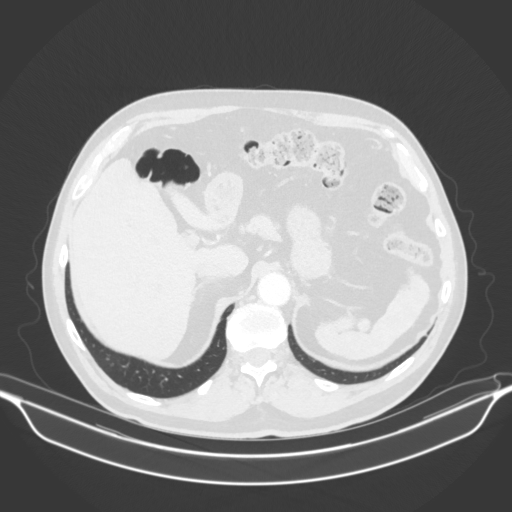
[im 61/192  lung]
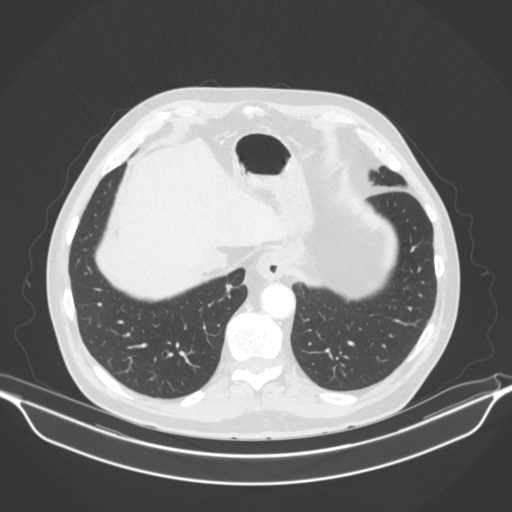
[im 70/192  mediastinal]
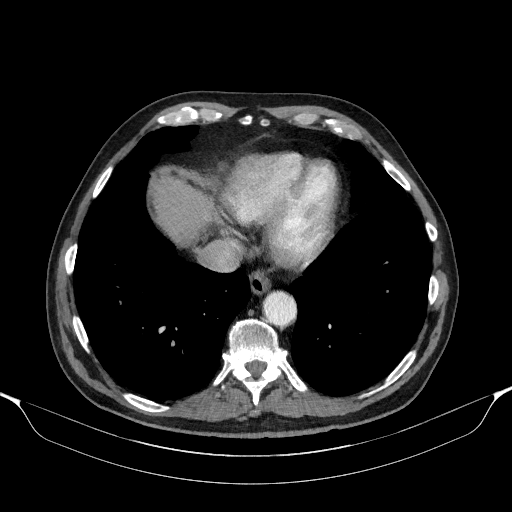
[im 70/192  lung]
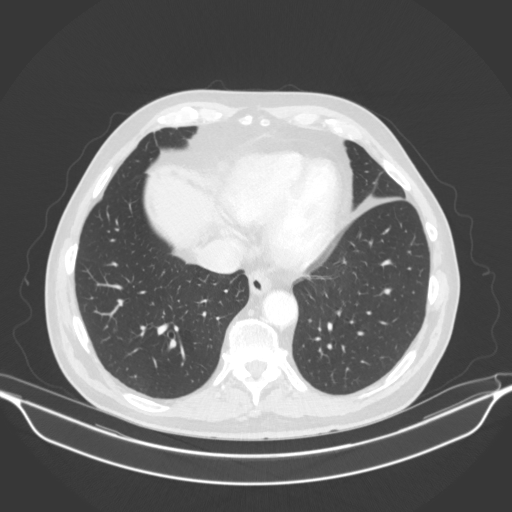
[im 87/192  lung]
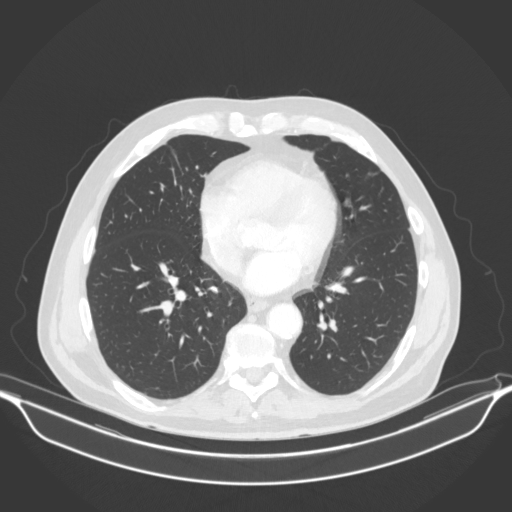
[im 105/192  lung]
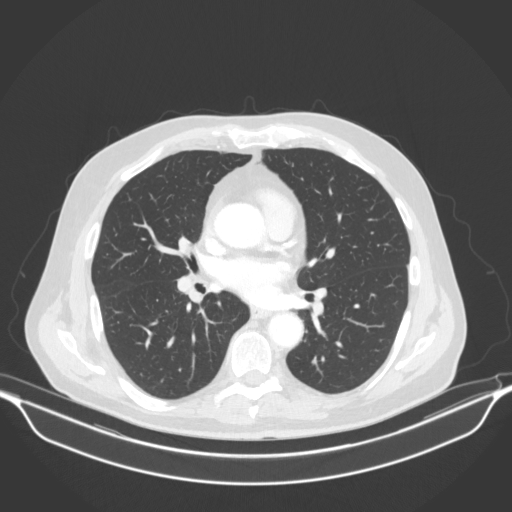
[im 122/192  lung]
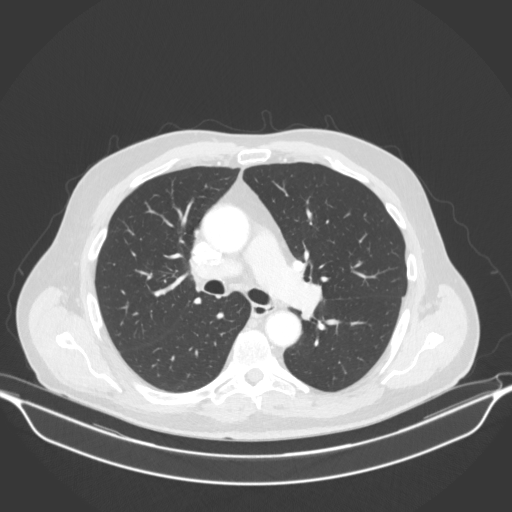
[im 131/192  mediastinal]
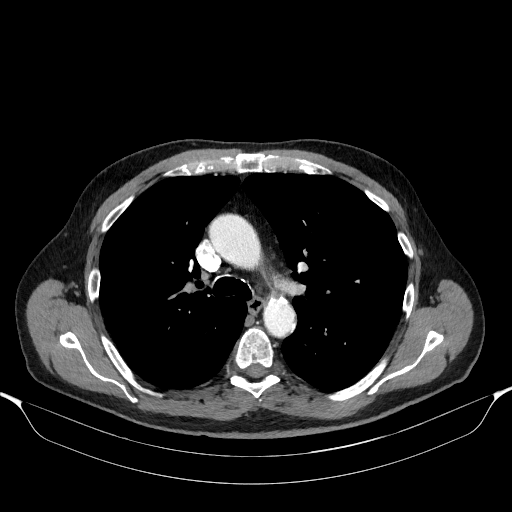
[im 131/192  lung]
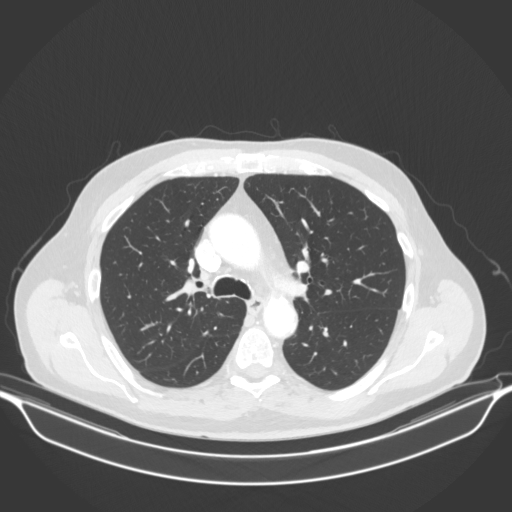
[im 148/192  lung]
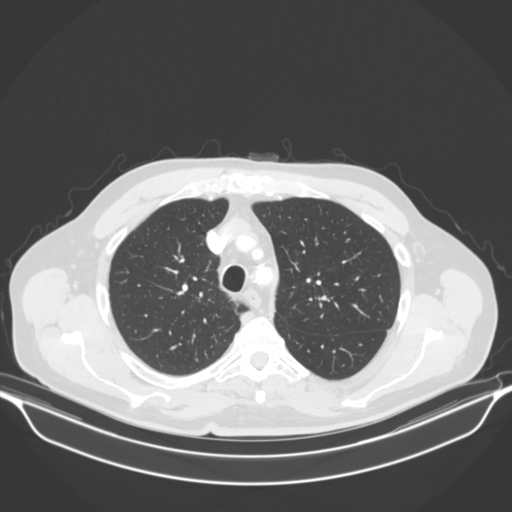
[im 165/192  lung]
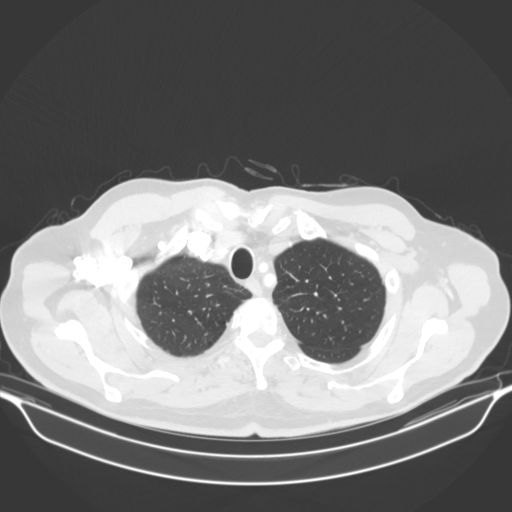
[im 183/192  lung]
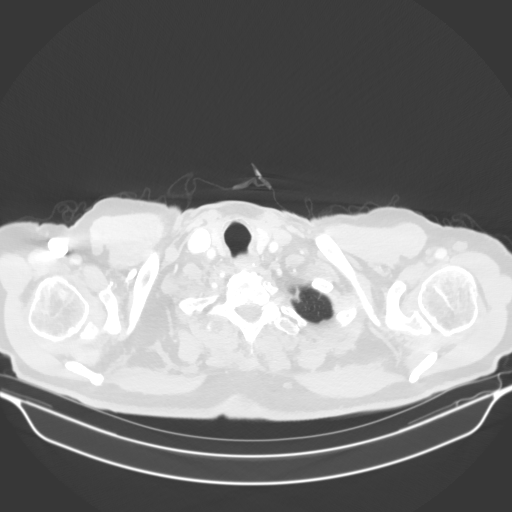

[Series 6: coronal · coronal · 0.77mm/px · 3 of 155 slices shown]
[im 31/155  lung]
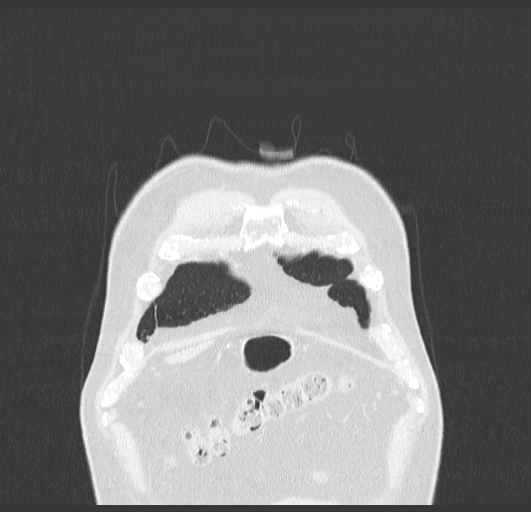
[im 62/155  lung]
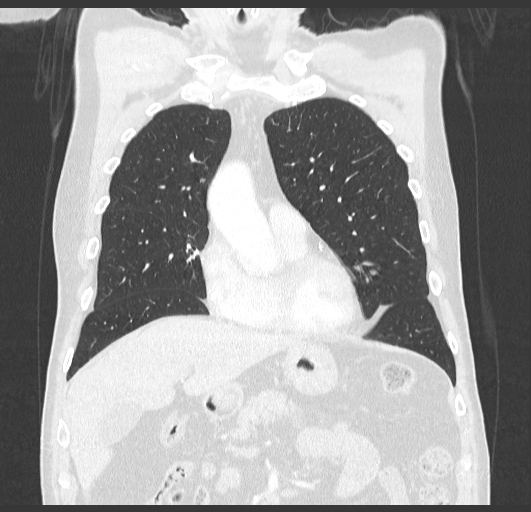
[im 93/155  lung]
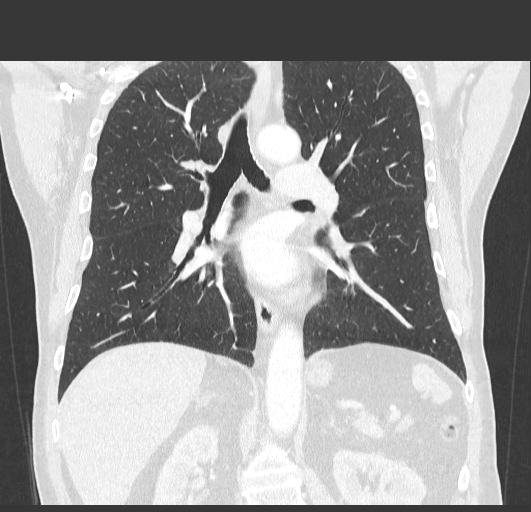

[15 of 36 positions shown; findings below may reference images not displayed]

FINDINGS: --------------------------------------------------------------------------- 
LUNGS AND PLEURA:   Stable scattered micronodules largest in the right lower 
lobe posteriorly measuring 3 mm (image 27 of axial MIP reconstruction). Right 
lower lobe calcified granuloma. 
MEDIASTINUM:  No pathologic lymphadenopathy.   
CARDIOVASCULAR:  Heart size is normal.  Coronary artery calcification is 
present.  Thoracic vascular calcifications.   
LOWER NECK:  No focal mass. 
CHEST WALL/AXILLA:  No mass or adenopathy.  
OSSEOUS STRUCTURES:  No acute osseous abnormality. Scattered degenerative 
changes.  
UPPER ABDOMEN:  Calcified granulomas in the spleen. 
---------------------------------------------------------------------------
IMPRESSION: 1.  Stable scattered micronodules measuring up to 3 mm, benign. 
2.  Coronary artery calcification. 
RADIATION DOSE REDUCTION: All CT scans are performed using radiation dose 
reduction techniques, when applicable.  Technical factors are evaluated and 
adjusted to ensure appropriate moderation of exposure.  Automated dose 
management technology is applied to adjust the radiation doses to minimize 
exposure while achieving diagnostic quality images.

## 2023-01-18 IMAGING — DX CERVICAL SPINE 2 VIEWS
1 series · 2 of 2 positions shown · non-contrast
Comparison: None

________________________________________________________________________________________________ 
CERVICAL SPINE 2 VIEWS, 01/18/2023 [DATE]: 
CLINICAL INDICATION: Neck pain and stiffness x3 months

[Series 1: lateral · U · 0.14mm/px · 2 of 2 slices shown]
[im 1/2]
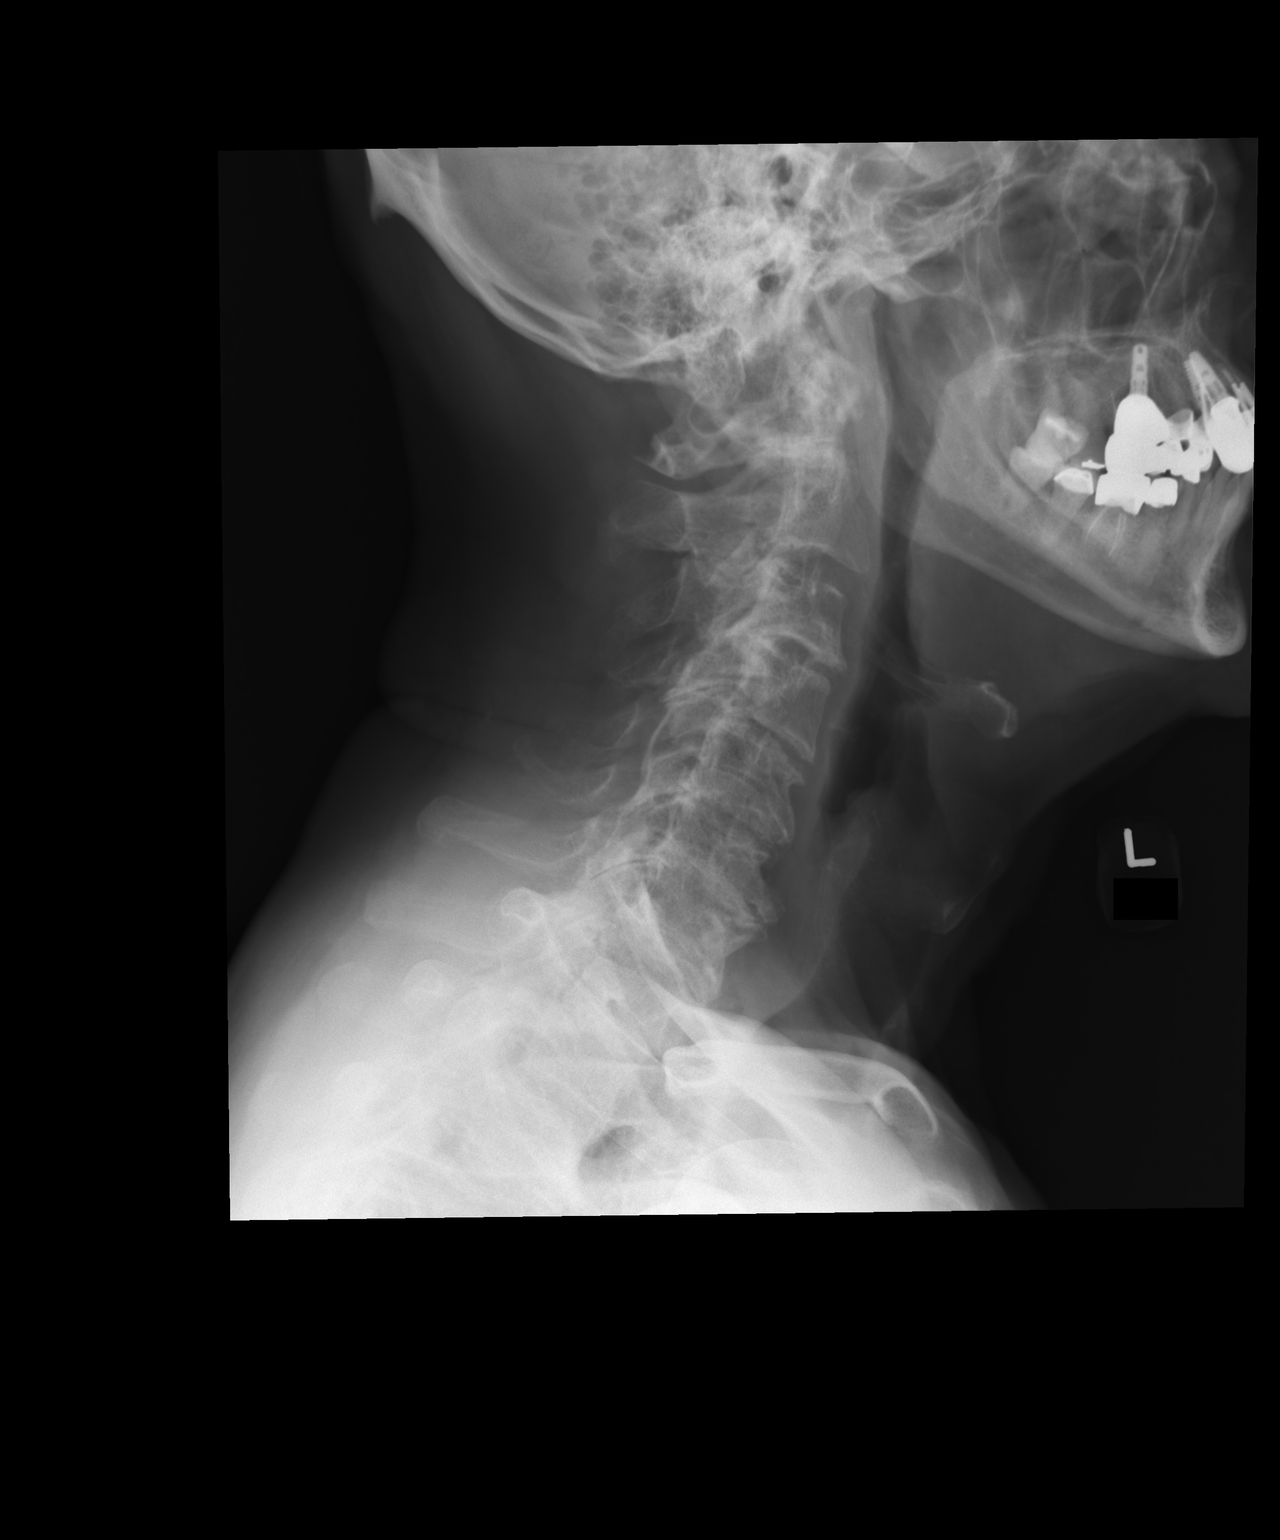
[im 2/2]
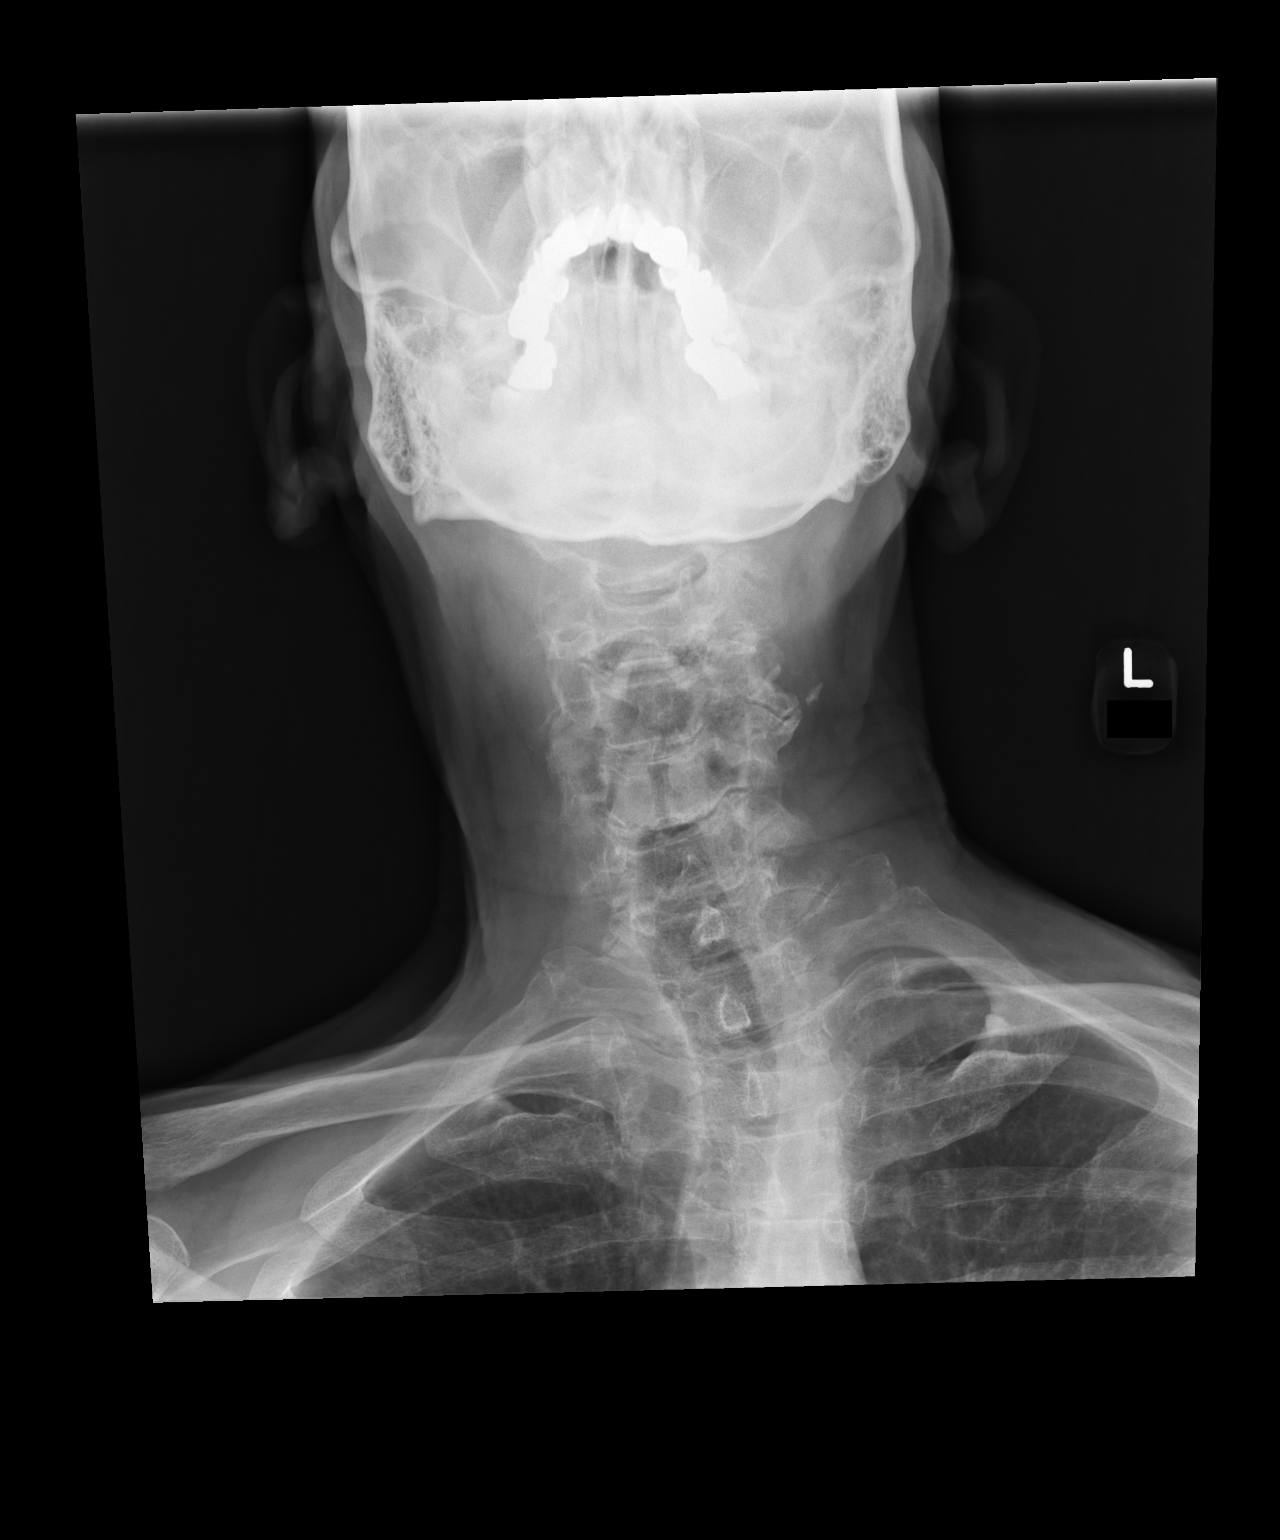

[2 of 2 positions shown; findings below may reference images not displayed]

FINDINGS: Osteopenia limits bone detail. Cervical vertebral heights are intact. 
There is moderately advanced spondylosis, with marked disc narrowing from C4 
through T1, moderate narrowing C3-4. The dens appears intact. There is mild 
cervical dextroscoliosis and upper thoracic levoscoliosis. There are 
zygapophyseal facet changes.
IMPRESSION: Moderately advanced spondylosis. No evidence for fracture. Mild cervicothoracic 
scoliosis. If indicated MRI would be useful for further evaluation.

## 2023-02-26 IMAGING — MR MRI CERVICAL SPINE WITHOUT CONTRAST
7 series · 32 of 48 positions shown · IV contrast (gadolinium)
Comparison: Cervical radiograph January 18, 2023

________________________________________________________________________________________________ 
MRI CERVICAL SPINE WITHOUT CONTRAST, 02/26/2023 [DATE]: 
CLINICAL INDICATION: Radiculopathy
TECHNIQUE: Sagittal T1, Sagittal T2, Sagittal STIR, Axial TSE and Axial 6UAAM 
images of the cervical spine were performed without intravenous gadolinium 
enhancement.

[Series 101: survey · axial · 10.0mm · 0.94mm/px · z∈[-15,+150]mm · 4 of 9 slices shown]
[im 1/9]
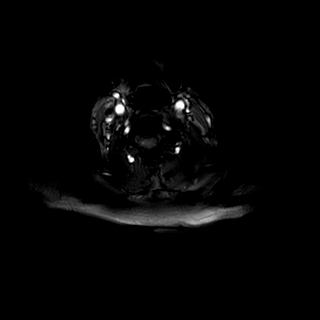
[im 3/9]
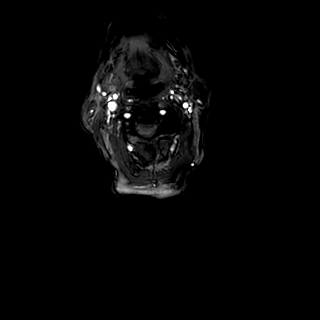
[im 6/9]
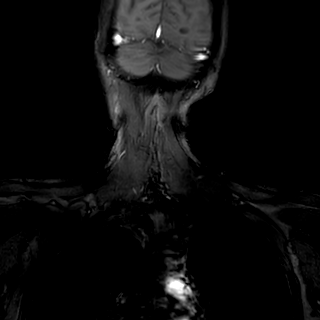
[im 9/9]
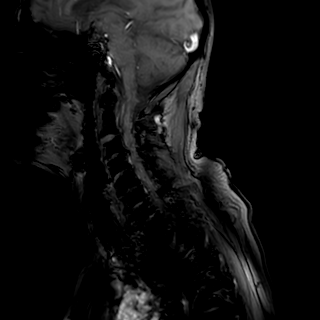

[Series 201: t2w_tse cor · coronal · 5.0mm · 0.52mm/px · 3 of 7 slices shown]
[im 1/7]
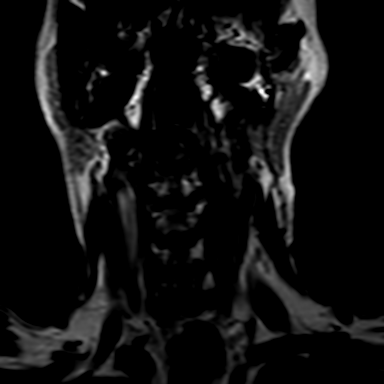
[im 4/7]
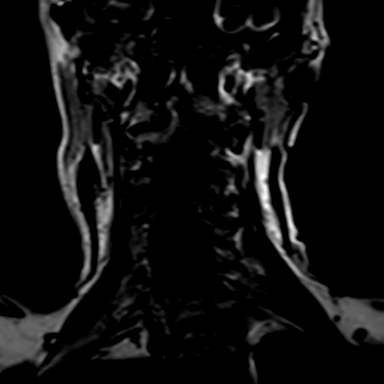
[im 7/7]
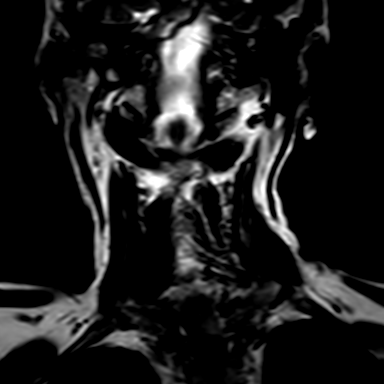

[Series 301: t1w_tse sag · sagittal · 3.0mm · 0.46mm/px · 5 of 15 slices shown]
[im 1/15]
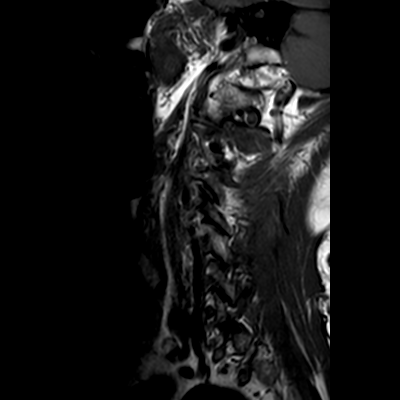
[im 4/15]
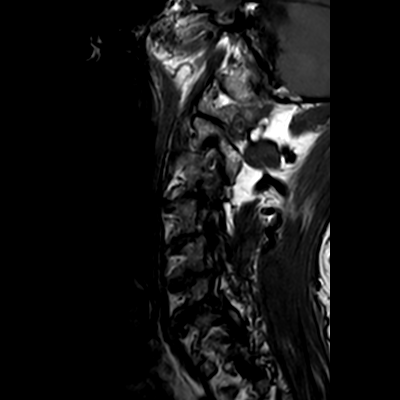
[im 8/15]
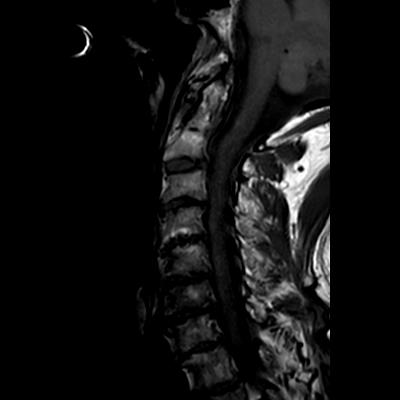
[im 11/15]
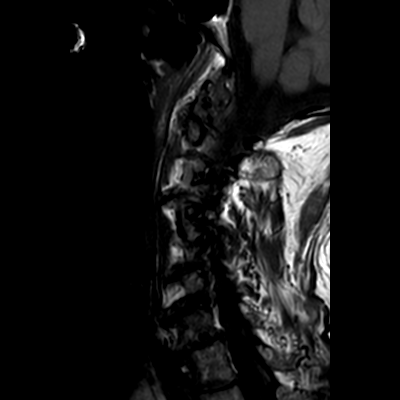
[im 15/15]
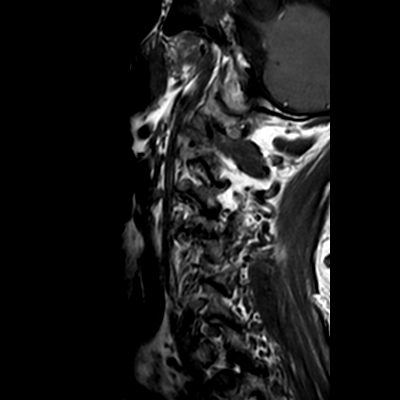

[Series 402: st2w_tse sag fs · sagittal · 3.0mm · 0.38mm/px · 5 of 15 slices shown]
[im 1/15]
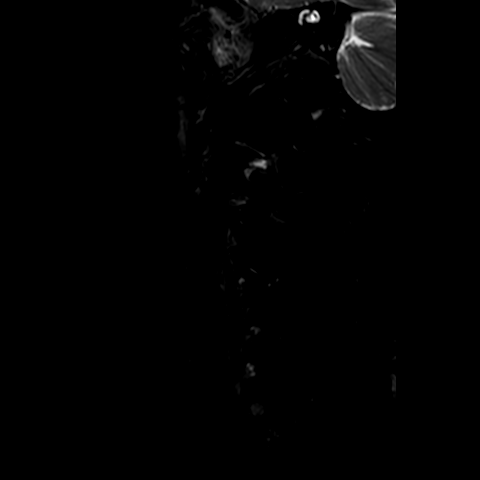
[im 4/15]
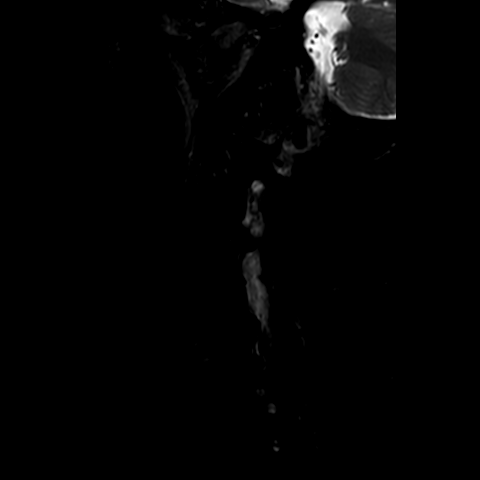
[im 8/15]
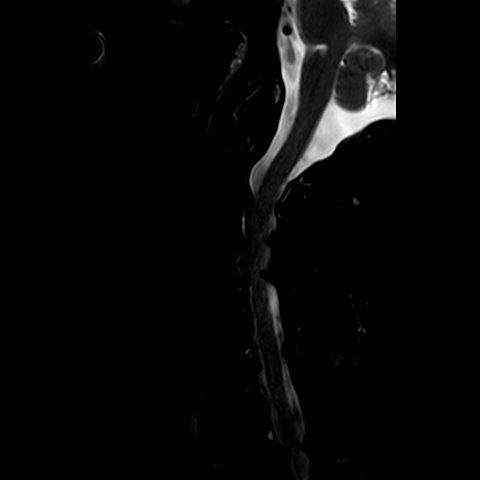
[im 11/15]
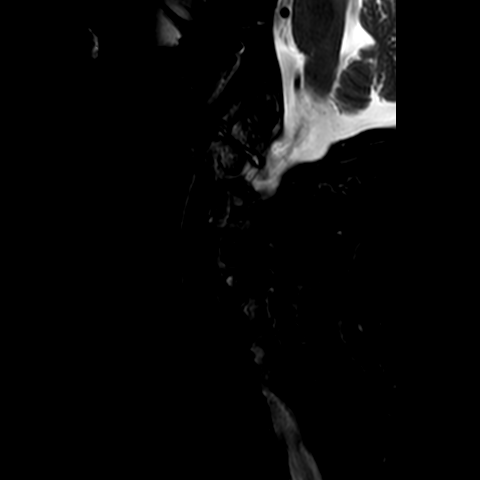
[im 15/15]
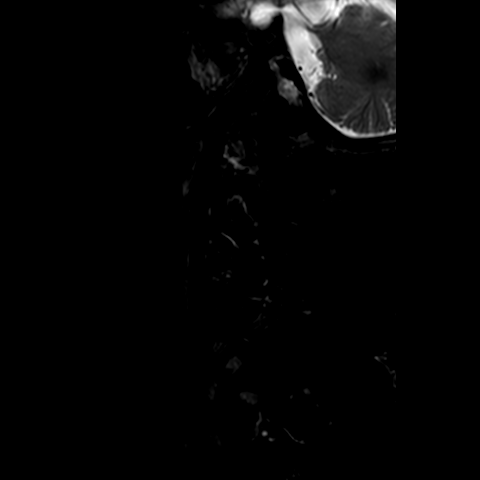

[Series 403: st2w_tse sag · sagittal · 3.0mm · 0.38mm/px · 5 of 15 slices shown]
[im 1/15]
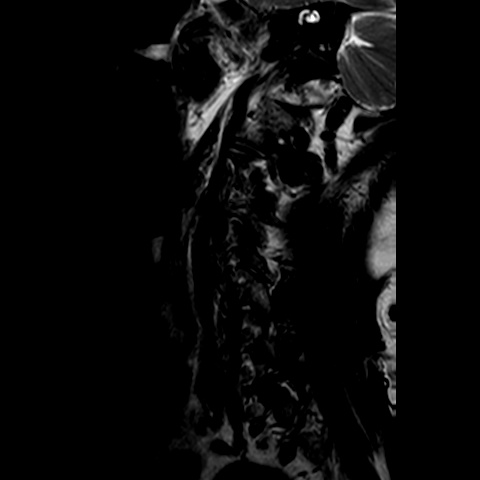
[im 4/15]
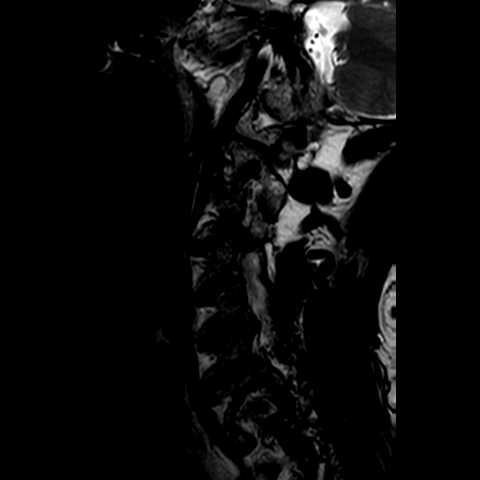
[im 8/15]
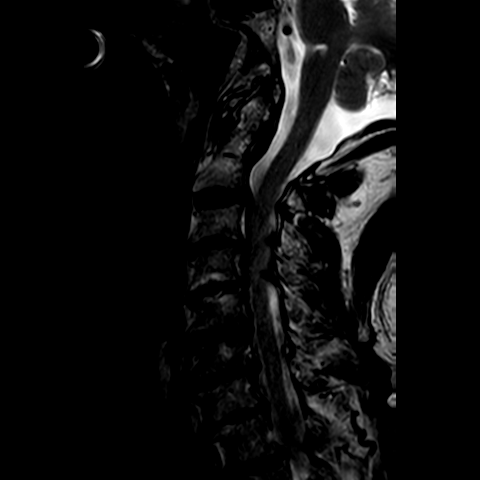
[im 11/15]
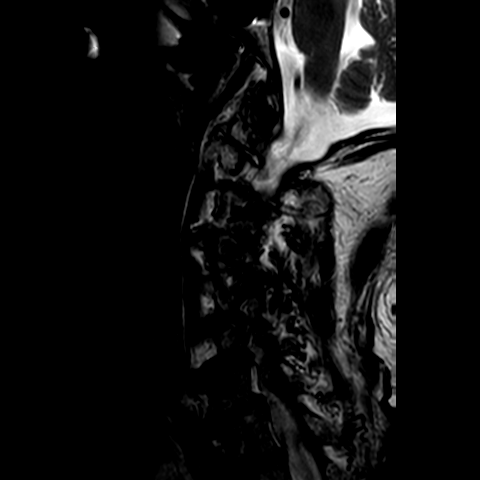
[im 15/15]
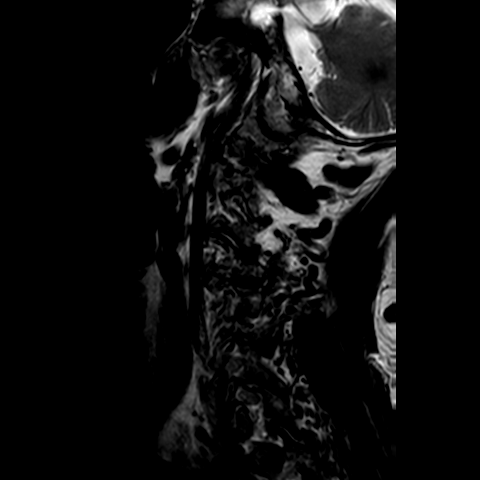

[Series 501: GRE · axial · 3.5mm · 0.67mm/px · z∈[-94,+22]mm · 8 of 30 slices shown]
[im 1/30]
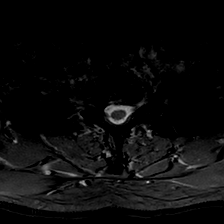
[im 6/30]
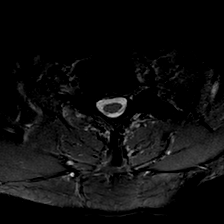
[im 9/30]
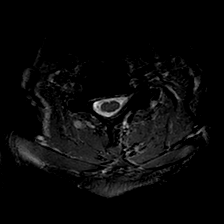
[im 12/30]
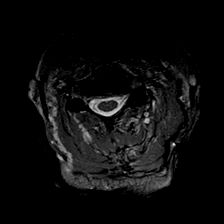
[im 18/30]
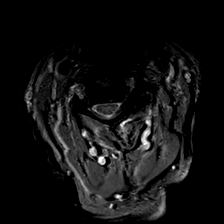
[im 21/30]
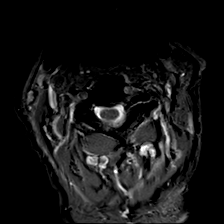
[im 24/30]
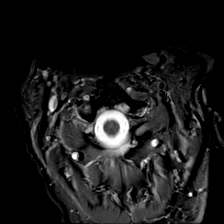
[im 30/30]
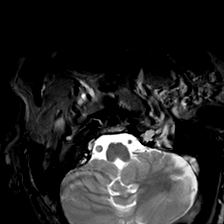

[Series 601: t2w_tse_ax · axial · 3.0mm · 0.32mm/px · z∈[-93,-78]mm · 2 of 41 slices shown]
[im 1/41]
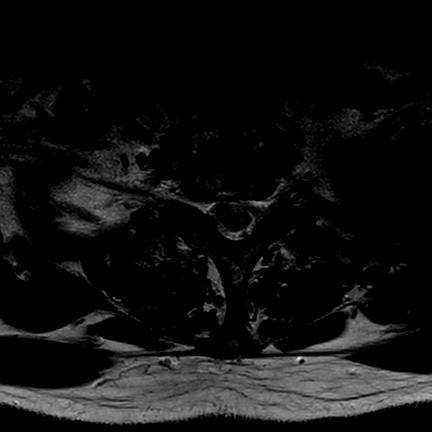
[im 6/41]
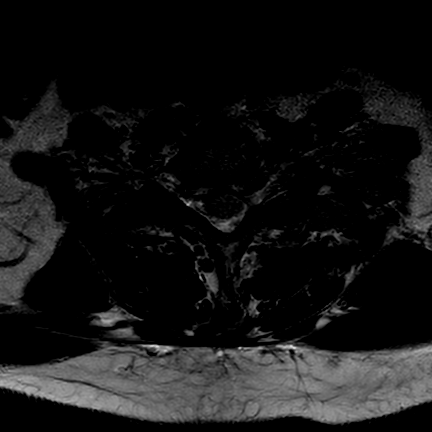

[32 of 48 positions shown; findings below may reference images not displayed]

FINDINGS: Todays study is limited by motion artifact. 
Cervical vertebral heights appear intact. There are atlantoaxial degenerative 
changes. The craniocervical junction is open. 
There is degenerative reactive edema involving the dens segments C2 vertebral 
segment. Mild Modic type I change at C3-4, C6-7 and C7-T1. 
No gross cord signal abnormality, within study limitations. 
There is disc-osteophyte mildly deforming the ventral cord at C2-3 and C3-4 with 
mild to moderate canal stenosis. Mild canal stenosis C4-5 and C6-7. The canal is 
open elsewhere. 
There is moderate left C2-3 foraminal stenosis. Marked right, moderate left C3-4 
foraminal stenosis. Marked left C4-5 and bilateral C5-6 foraminal stenosis. 
Marked right, moderate to marked left C6-7 foraminal stenosis. Moderate 
bilateral C7-T1 foraminal stenosis..
IMPRESSION: Todays study is limited by motion artifact. There is spondylosis with 
significant multilevel foraminal stenosis. There is mild cord deformity at C2-3 
and C3-4 due to disc-osteophyte, with mild to moderate canal stenosis at both 
levels. 
Mild cervical dextroscoliosis. 
Multilevel Modic type I changes. 
Atlantoaxial degenerative change. 
Within study limits, cord signal appears normal. No evidence for spinal 
malignancy.

## 2023-05-10 ENCOUNTER — Other Ambulatory Visit (INDEPENDENT_AMBULATORY_CARE_PROVIDER_SITE_OTHER): Payer: Self-pay | Admitting: FAMILY PRACTICE

## 2023-05-10 DIAGNOSIS — M542 Cervicalgia: Secondary | ICD-10-CM

## 2023-05-10 DIAGNOSIS — M5020 Other cervical disc displacement, unspecified cervical region: Secondary | ICD-10-CM

## 2023-05-12 ENCOUNTER — Other Ambulatory Visit: Payer: Self-pay

## 2023-05-12 ENCOUNTER — Ambulatory Visit (INDEPENDENT_AMBULATORY_CARE_PROVIDER_SITE_OTHER)
Admission: RE | Admit: 2023-05-12 | Discharge: 2023-05-12 | Disposition: A | Discharge status: Still In Hospital | Payer: Medicare Other | Source: Ambulatory Visit | Attending: FAMILY PRACTICE | Admitting: FAMILY PRACTICE

## 2023-05-12 DIAGNOSIS — M5020 Other cervical disc displacement, unspecified cervical region: Secondary | ICD-10-CM

## 2023-05-12 DIAGNOSIS — M542 Cervicalgia: Secondary | ICD-10-CM

## 2023-05-12 DIAGNOSIS — G8929 Other chronic pain: Secondary | ICD-10-CM

## 2023-05-12 NOTE — PT Treatment (Signed)
[facility] Medicine [facility]   Outpatient Physical Therapy  [address]    [place], [place] [address]  Phone [phone]  FAX [phone]      OUTPATIENT PHYSICAL THERAPY TREATMENT NOTE    GENERAL INFORMATION  Patient Name: Joe Young  Date of Birth: August 23, 1941  Diagnosis:      ICD-10-CM    1. Chronic neck pain  M54.2     G89.29       2. 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 Cervicalgia  M54.2          Date of service: 05/12/23     Visit Tracker  Visit Number:  1  Visits Authorized:    Authorization End Date:  08/12/2023  Treatment Plan Visits:     Medicare Certification/Re-certification End Date:  08/12/2023    Plan of Care Counter:  Physical Therapy Visit     SUBJECTIVE  Patient reports feeling capable of performing home exercise.    OBJECTIVE  Selective Functional Movement Assessment:  Cervical Patterns  Cervical Flexion:  Dysfunctional, non-painful  Cervical Extension:  Dysfunctional, painful  Cervical Rotation Right:  Dysfunctional, painful  Cervical Rotation Left:  Dysfunctional, painful  Shoulder Patterns                Right Shoulder Impingement Test: Positive                Left Shoulder Impingement Test:  Negative  Right Shoulder Pattern 1:  Dysfunctional, painful  Left Shoulder Pattern 1:  Dysfunctional, non-painful  Right Shoulder Pattern 2:  Dysfunctional, painful  Left shoulder Pattern 2:  Dysfunctional, non-painful  Multisegmental Patterns  Multisegmental Flexion:  Dysfunctional, non-painful  Multisegmental Extension:  Dysfunctional, non-painful      Multisegmental Rotation Right:  Dysfunctional, non-painful  Multisegmental Rotation Left:  Dysfunctional, non-painful  Single Leg Stance  Single Leg Stance Right:  Dysfunctional, non-painful  Single Leg Stance Left:  Dysfunctional, non-painful  Deep Squat                Deep Squat: Dysfunctional, non-painful   Normal upper 1/4 screen for myotome  Negative Hoffmans and Clonus  Cervical AROM:  40deg rotation  to right, 20deg rotation to left  Cervical breakout:  DP C1/C2 AA PROM rotation to left and right  Manual therapy:  Manual cervical traction/distraction  Cervical METs for flexion and rotation  CT and thoracic distraction manipulation  Therapeutic exercise  Quadruped flexion (childs pose)  ASSESSMENT  Improved cervical pain with rotation to left and right post tx but continued to be DP.      PLAN  Manual therapy, neuromuscular re-education, therapeutic exercise.      TREATMENT MINUTES             Timed Minutes         Non-timed Minutes  Therapeutic Exercise 5 Hot Pack/cold pack    Manual Therapy 15 Unattended Electrical Stimulation    Neuromuscular Re-education  Traction    Gait  Dry Needling    Manual Electrical Stimulation  Canalith Repositioning    Ultrasound  Biofeedback    Ultrasound with dexamethasone  Vasopneumatic Compression    Total Timed Treatment Minutes 15     Total Treatment Time 15       Fabian November, PT

## 2023-05-12 NOTE — PT Evaluation (Signed)
[facility] Medicine [facility]   Outpatient Physical Therapy  [address]    [place], [place] [address]  Phone [phone]  Valinda Hoar [phone]      OUTPATIENT PHYSICAL THERAPY EVALUATION    GENERAL INFORMATION  Patient Name:Joe Young  Date of Birth: April 19, 1941  Date of service: 05/12/2023     PLAN OF CARE COUNTER:  Physical Therapy Evaluation      SUBJECTIVE  Patient accompanied by:  Patient   Chief complaint:  Neck pain;   loss of neck ROM  Date of onset:  6months  Mechanism of injury:  none  Surgery related to current condition:  none  Diagnostic tests:  cervical xray;  cervical MRI  Previous treatment:  physical therapy  Ongoing treatment:  none  Aggravating factors:  turning head to left and right  Relieving factors:  laying  Functional limitations:  turning head  Prior level of function:  no known deficits    Living Environment:  Married  Employment:  Retired Psychologist, sport and exercise  Leisure/Sports Activities:  Golfing  Patient's Goals:  no neck pain;  turn head to left  OBJECTIVE  Selective Functional Movement Assessment:  Cervical Patterns  Cervical Flexion:  Dysfunctional, non-painful  Cervical Extension:  Dysfunctional, painful  Cervical Rotation Right:  Dysfunctional, painful  Cervical Rotation Left:  Dysfunctional, painful  Shoulder Patterns   Right Shoulder Impingement Test: Positive   Left Shoulder Impingement Test:  Negative  Right Shoulder Pattern 1:  Dysfunctional, painful  Left Shoulder Pattern 1:  Dysfunctional, non-painful  Right Shoulder Pattern 2:  Dysfunctional, painful  Left shoulder Pattern 2:  Dysfunctional, non-painful  Multisegmental Patterns  Multisegmental Flexion:  Dysfunctional, non-painful  Multisegmental Extension:  Dysfunctional, non-painful   Multisegmental Rotation Right:  Dysfunctional, non-painful  Multisegmental Rotation Left:  Dysfunctional, non-painful  Single Leg Stance  Single Leg Stance Right:  Dysfunctional, non-painful  Single Leg Stance Left:  Dysfunctional, non-painful  Deep  Squat   Deep Squat: Dysfunctional, non-painful   Normal upper 1/4 screen for myotome  Negative Hoffmans and Clonus  Cervical AROM:  40deg rotation to right, 20deg rotation to left  Cervical breakout:  DP C1/C2 AA PROM rotation to left and right    ASSESSMENT  Evaluation Statement:  82yo with complaints of 6months of chronic neck pain with loss of cervical rotation.   Demonstrating dysfunctional painful and dysfunctional non-painful WB movement patterns.   Would benefit from skilled PT for pain management and movement restoration.     Rehabilitation Potential:  Good  Physician Diagnosis:      ICD-10-CM    1. Chronic neck pain  M54.2     G89.29       2. 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 Cervicalgia  M54.2         Physical Therapist Diagnosis:   Neck pain - M54.2 and Neck mobility impairments - M53.82  Problems and Goals:       Initial Problems/Status             Current Status                     Goals  Dysfunctional painful cervical patterns.          Functional non-painful cervical patterns.    7/10 neck pain.          0/10neck pain.  Evaluation Complexity:   Personal Factors Impacting Plan of Care:  none   Co-morbidities Impacting Plan of Care:  none   Body Structures Examined:  head, neck, back, trunk, upper extremities, and lower extremities   Body Functions Examined:  musculoskeletal function and neuromuscular function   Activity Limitations and Participation Restrictions Examined:  mobility, self-care, major life areas, and community/social life   Clinical Presentation:  Stable   Evaluation Complexity:  Low-history 0, examination 1-2, stable presentation    PLAN:  Consent for treatment provided by:  Patient  Frequency per day:  1  Frequency per week:  2  Duration weeks:  12  Modalities:     Therapeutic interventions:  dry needling (16109 and 20561), joint mobilization (97140), soft tissue mobilization (97140), strengthening  exercises (97110), ROM exercises (97110), stretching exercises (97110), and stability and motor control exercises  (60454)    EVALUATION MINUTES:  20      MEDICARE CERTIFICATION:   Start of Care Date:   05/12/2023.   Certification End Date:  08-12-2023   Physician Name:  Clair Gulling     Physician Signature___________________________________Date___________________    Fabian November, PT

## 2023-05-17 ENCOUNTER — Ambulatory Visit (INDEPENDENT_AMBULATORY_CARE_PROVIDER_SITE_OTHER): Payer: Self-pay

## 2023-05-31 ENCOUNTER — Other Ambulatory Visit: Payer: Self-pay

## 2023-05-31 ENCOUNTER — Ambulatory Visit
Admission: RE | Admit: 2023-05-31 | Discharge: 2023-05-31 | Disposition: A | Discharge status: Still In Hospital | Payer: Medicare Other | Source: Ambulatory Visit | Attending: FAMILY PRACTICE | Admitting: FAMILY PRACTICE

## 2023-05-31 NOTE — PT Treatment (Signed)
[facility] Medicine [facility]   Outpatient Physical Therapy  [address]    [place], [place] [address]  Phone [phone]  FAX [phone]      OUTPATIENT PHYSICAL THERAPY TREATMENT NOTE    GENERAL INFORMATION  Patient Name: Joe Young  Date of Birth: 09-26-1941  Diagnosis:    No diagnosis found.     Date of service: 05/31/23     Visit Tracker  Visit Number:  2  Visits Authorized:    Authorization End Date:  08/12/2023  Treatment Plan Visits:     Medicare Certification/Re-certification End Date:  08/12/2023    Plan of Care Counter:  Physical Therapy Visit     SUBJECTIVE  General soreness after initial eval and tx but reports some decreased pain and improved cervical ROM 24 hrs post tx.       OBJECTIVE  Selective Functional Movement Assessment:  Cervical Patterns  Cervical Flexion:  Dysfunctional, non-painful  Cervical Extension:  Dysfunctional, painful  Cervical Rotation Right:  Dysfunctional, painful  Cervical Rotation Left:  Dysfunctional, painful  Shoulder Patterns                Right Shoulder Impingement Test: Positive                Left Shoulder Impingement Test:  Negative  Right Shoulder Pattern 1:  Dysfunctional, painful  Left Shoulder Pattern 1:  Dysfunctional, non-painful  Right Shoulder Pattern 2:  Dysfunctional, painful  Left shoulder Pattern 2:  Dysfunctional, non-painful  Multisegmental Patterns  Multisegmental Flexion:  Dysfunctional, non-painful  Multisegmental Extension:  Dysfunctional, non-painful      Multisegmental Rotation Right:  Dysfunctional, non-painful  Multisegmental Rotation Left:  Dysfunctional, non-painful  Single Leg Stance  Single Leg Stance Right:  Dysfunctional, non-painful  Single Leg Stance Left:  Dysfunctional, non-painful  Deep Squat                Deep Squat: Dysfunctional, non-painful   Normal upper 1/4 screen for myotome  Negative Hoffmans and Clonus  Cervical AROM:  40deg rotation to right, 20deg rotation to left  Cervical breakout:  DP C1/C2 AA PROM rotation to left  and right  Manual therapy:  Manual cervical traction/distraction  Cervical METs for flexion and rotation  CT and thoracic distraction manipulation  FDN with manual elelctrical stimulation to C2-C4 multifidus  Therapeutic exercise  Quadruped flexion   ASSESSMENT  Improved cervical pain with rotation to left and right post tx but continued to be DP.      PLAN  Manual therapy, neuromuscular re-education, therapeutic exercise.      TREATMENT MINUTES             Timed Minutes         Non-timed Minutes  Therapeutic Exercise 5 Hot Pack/cold pack    Manual Therapy 25 Unattended Electrical Stimulation    Neuromuscular Re-education  Traction    Gait  Dry Needling    Manual Electrical Stimulation  Canalith Repositioning    Ultrasound  Biofeedback    Ultrasound with dexamethasone  Vasopneumatic Compression    Total Timed Treatment Minutes 30     Total Treatment Time 30       Fabian November, PT

## 2023-06-07 ENCOUNTER — Ambulatory Visit (INDEPENDENT_AMBULATORY_CARE_PROVIDER_SITE_OTHER): Payer: Self-pay

## 2023-06-21 ENCOUNTER — Ambulatory Visit (INDEPENDENT_AMBULATORY_CARE_PROVIDER_SITE_OTHER): Payer: Self-pay

## 2023-06-28 ENCOUNTER — Ambulatory Visit (INDEPENDENT_AMBULATORY_CARE_PROVIDER_SITE_OTHER)
Admission: RE | Admit: 2023-06-28 | Discharge: 2023-06-28 | Disposition: A | Discharge status: Still In Hospital | Payer: Medicare Other | Source: Ambulatory Visit | Attending: FAMILY PRACTICE | Admitting: FAMILY PRACTICE

## 2023-06-28 ENCOUNTER — Other Ambulatory Visit: Payer: Self-pay

## 2023-06-28 DIAGNOSIS — M5020 Other cervical disc displacement, unspecified cervical region: Secondary | ICD-10-CM | POA: Insufficient documentation

## 2023-06-28 DIAGNOSIS — M542 Cervicalgia: Secondary | ICD-10-CM | POA: Insufficient documentation

## 2023-06-28 NOTE — PT Treatment (Signed)
[facility] Medicine [facility]   Outpatient Physical Therapy  [address]    [place], [place] [address]  Phone [phone]  FAX [phone]      OUTPATIENT PHYSICAL THERAPY TREATMENT NOTE    GENERAL INFORMATION  Patient Name: Joe Young  Date of Birth: 07/03/1941  Diagnosis:    No diagnosis found.     Date of service: 06/28/23     Visit Tracker  Visit Number:  3  Visits Authorized:    Authorization End Date:  08/12/2023  Treatment Plan Visits:     Medicare Certification/Re-certification End Date:  08/12/2023    Plan of Care Counter:  Physical Therapy Visit     SUBJECTIVE  Continued left side neck pain with rotation to left and right.      OBJECTIVE  Selective Functional Movement Assessment:  Cervical Patterns  Cervical Flexion:  Dysfunctional, non-painful  Cervical Extension:  Dysfunctional, painful  Cervical Rotation Right:  Dysfunctional, painful  Cervical Rotation Left:  Dysfunctional, painful  Shoulder Patterns                Right Shoulder Impingement Test: Positive                Left Shoulder Impingement Test:  Negative  Right Shoulder Pattern 1:  Dysfunctional, painful  Left Shoulder Pattern 1:  Dysfunctional, non-painful  Right Shoulder Pattern 2:  Dysfunctional, painful  Left shoulder Pattern 2:  Dysfunctional, non-painful  Multisegmental Patterns  Multisegmental Flexion:  Dysfunctional, non-painful  Multisegmental Extension:  Dysfunctional, non-painful      Multisegmental Rotation Right:  Dysfunctional, non-painful  Multisegmental Rotation Left:  Dysfunctional, non-painful  Single Leg Stance  Single Leg Stance Right:  Dysfunctional, non-painful  Single Leg Stance Left:  Dysfunctional, non-painful  Deep Squat                Deep Squat: Dysfunctional, non-painful   Normal upper 1/4 screen for myotome  Negative Hoffmans and Clonus  Cervical AROM:  40deg rotation to right, 20deg rotation to left  Cervical breakout:  DP C1/C2 AA PROM rotation to left and right  Manual therapy:  Manual cervical  traction/distraction  Cervical METs for flexion and rotation  CT and thoracic distraction manipulation  FDN with manual elelctrical stimulation to C2-C4 multifidus  Therapeutic exercise  Quadruped flexion   ASSESSMENT  Improving end range cervical rotation but continues to have DP supine passive cervical rotation to left.       PLAN  Manual therapy, neuromuscular re-education, therapeutic exercise.      TREATMENT MINUTES             Timed Minutes         Non-timed Minutes  Therapeutic Exercise 5 Hot Pack/cold pack    Manual Therapy 25 Unattended Electrical Stimulation    Neuromuscular Re-education  Traction    Gait  Dry Needling    Manual Electrical Stimulation  Canalith Repositioning    Ultrasound  Biofeedback    Ultrasound with dexamethasone  Vasopneumatic Compression    Total Timed Treatment Minutes 30     Total Treatment Time 30       Fabian November, PT

## 2023-07-05 ENCOUNTER — Other Ambulatory Visit: Payer: Self-pay

## 2023-07-05 ENCOUNTER — Ambulatory Visit
Admission: RE | Admit: 2023-07-05 | Discharge: 2023-07-05 | Disposition: A | Discharge status: Still In Hospital | Payer: Medicare Other | Source: Ambulatory Visit | Attending: FAMILY PRACTICE | Admitting: FAMILY PRACTICE

## 2023-07-05 NOTE — PT Treatment (Signed)
[facility] Medicine [facility]   Outpatient Physical Therapy  [address]    [place], [place] [address]  Phone [phone]  FAX [phone]      OUTPATIENT PHYSICAL THERAPY TREATMENT NOTE    GENERAL INFORMATION  Patient Name: Joe Young  Date of Birth: 1941/10/22  Diagnosis:    No diagnosis found.     Date of service: 07/05/23     Visit Tracker  Visit Number:  4  Visits Authorized:    Authorization End Date:  08/12/2023  Treatment Plan Visits:     Medicare Certification/Re-certification End Date:  08/12/2023    Plan of Care Counter:  Physical Therapy Visit     SUBJECTIVE  Primary complaint of right side ear (TMJ region)pain.  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   Feels neck pain is slightly reduced with improved cervical ROM.       OBJECTIVE  Selective Functional Movement Assessment:  Cervical Patterns  Cervical Flexion:  Dysfunctional, non-painful  Cervical Extension:  Dysfunctional, non-painful  Cervical Rotation Right:  Dysfunctional, non- painful  Cervical Rotation Left:  Dysfunctional, painful  Shoulder Patterns                Right Shoulder Impingement Test: Positive                Left Shoulder Impingement Test:  Negative  Right Shoulder Pattern 1:  Dysfunctional, non-painful  Left Shoulder Pattern 1:  Dysfunctional, non-painful  Right Shoulder Pattern 2:  Dysfunctional, non-painful  Left shoulder Pattern 2:  Dysfunctional, non-painful  Multisegmental Patterns  Multisegmental Flexion:  Dysfunctional, non-painful  Multisegmental Extension:  Dysfunctional, non-painful      Multisegmental Rotation Right:  Dysfunctional, non-painful  Multisegmental Rotation Left:  Dysfunctional, non-painful  Single Leg Stance  Single Leg Stance Right:  Dysfunctional, non-painful  Single Leg Stance Left:  Dysfunctional, non-painful  Deep Squat                Deep Squat: Dysfunctional, non-painful   Normal upper 1/4 screen for myotome  Negative Hoffmans and Clonus  Cervical AROM:  40deg rotation to right, 20deg  rotation to left  Cervical breakout:  DP C1/C2 AA PROM rotation to left and right  Manual therapy:  Manual cervical traction/distraction  Cervical METs for flexion and rotation  CT and thoracic distraction manipulation  Mid cervical lateral glides to left and right  AA mobilization to left and right via end range prone cervical flexion with rotation to left and right  Therapeutic exercise  Quadruped flexion   ASSESSMENT  Symmetrical cervical rotation left and right post manual therapy but still DN.    PLAN  Manual therapy, neuromuscular re-education, therapeutic exercise.      TREATMENT MINUTES             Timed Minutes         Non-timed Minutes  Therapeutic Exercise 5 Hot Pack/cold pack    Manual Therapy 25 Unattended Electrical Stimulation    Neuromuscular Re-education  Traction    Gait  Dry Needling    Manual Electrical Stimulation  Canalith Repositioning    Ultrasound  Biofeedback    Ultrasound with dexamethasone  Vasopneumatic Compression    Total Timed Treatment Minutes 30     Total Treatment Time 30       Fabian November, PT

## 2023-07-12 ENCOUNTER — Ambulatory Visit (INDEPENDENT_AMBULATORY_CARE_PROVIDER_SITE_OTHER): Payer: Self-pay

## 2023-07-31 IMAGING — MR MULTI PARAMETRIC MRI PROSTATE W/O AND W
13 series · 48 of 48 positions shown · IV contrast (gadavist)
Comparison: None

________________________________________________________________________________________________ 
MULTI PARAMETRIC MRI PROSTATE W/O AND W, 07/31/2023 [DATE]: 
CLINICAL INDICATION: Benign Prostatic Hyperplasia Without Lower Urinary Tract 
Symptoms. Elevated Prostate Specific Antigen [psa]
TECHNIQUE: Multiple parametric sequences were performed Pre-contrast: T1 axial 
of the entire pelvis. T2 sagittal, axial  and coronal,T1 axial, acquired of the 
prostate. Diffusion with multiple B values of 3999, 1266 calculated ADC value 
for mapping. Post contrast: Rapid sequence dynamic and axial planes through the 
prostate and seminal vesicles,T1 axial with fat sat of the entire pelvis. 3-D 
renderings were reconstructed on an independent workstation. The images were 
also evaluated with Dyna CAD computer aided detection. 8.0 mL of Gadavist were 
injected intravenously. 2 mL of Gadavist discarded. As per [HOSPITAL] guidelines 3D reconstructions are performed with concurrent physician 
supervision. Patient was scanned on a 3T magnet.

[Series 101: survey-mst · axial · 10.0mm · 1.34mm/px · 1 of 14 slices shown]
[im 1/14]
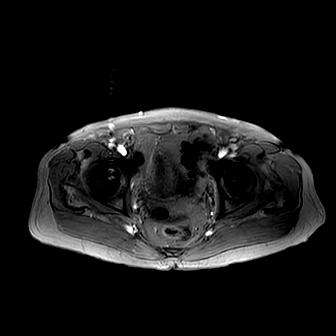

[Series 201: t1w_tse_ax · axial · 6.0mm · 0.37mm/px · 1 of 36 slices shown]
[im 1/36]
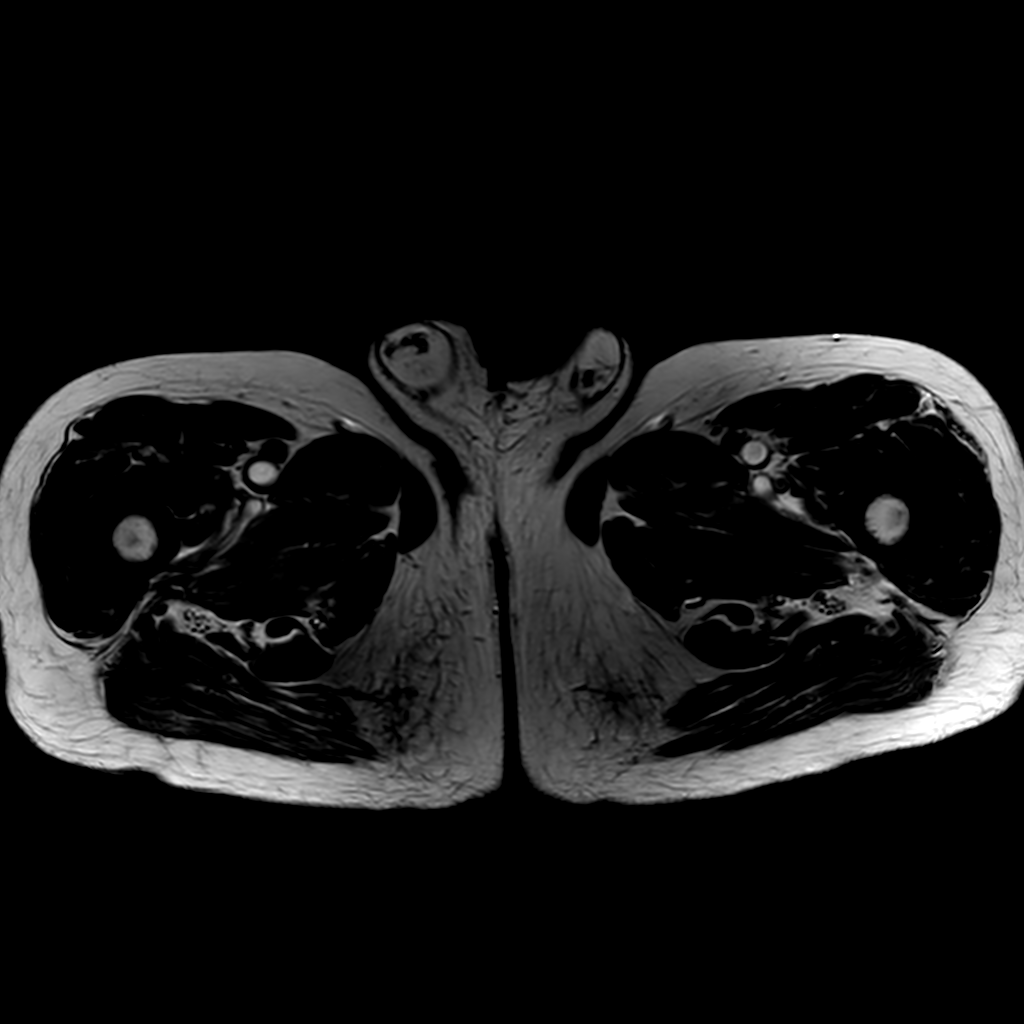

[Series 301: t2w sag · sagittal · 3.0mm · 0.42mm/px · 1 of 30 slices shown]
[im 1/30]
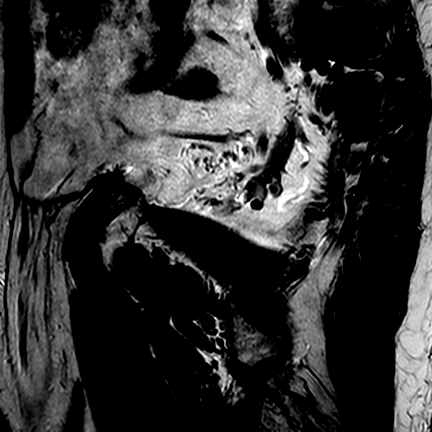

[Series 401: t2w cor · coronal · 3.0mm · 0.42mm/px · 1 of 30 slices shown]
[im 1/30]
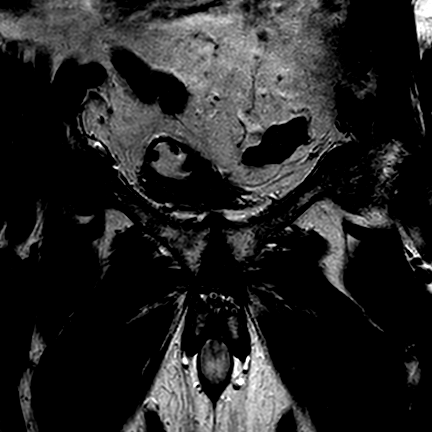

[Series 501: t2w ax · axial · 3.0mm · 0.38mm/px · 1 of 32 slices shown]
[im 1/32]
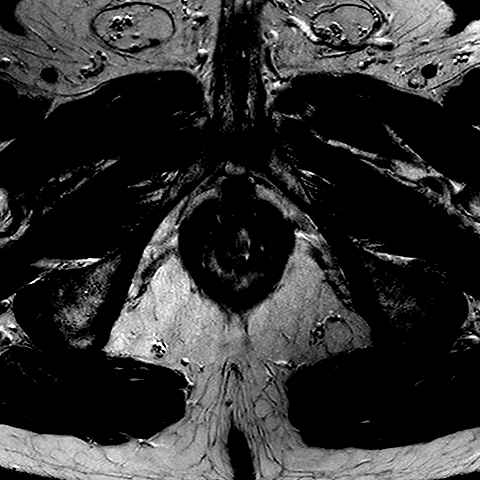

[Series 601: new-dwi_3b* 3mm* · axial · 3.0mm · 1.28mm/px · 1 of 64 slices shown]
[im 1/64]
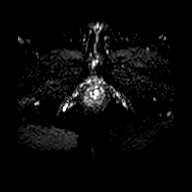

[Series 602: ADC · axial · 3.0mm · 1.28mm/px · 1 of 32 slices shown (1 of 2)]
[im 1/32]
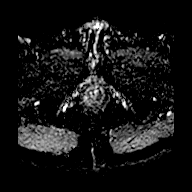

[Series 603: ADC · axial · 3.0mm · 1.28mm/px · 1 of 30 slices shown (2 of 2)]
[im 1/30]
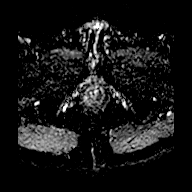

[Series 604: (id) · axial · 3.0mm · 1.28mm/px · 1 of 32 slices shown (1 of 3)]
[im 1/32]
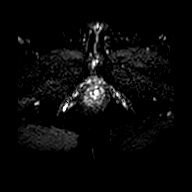

[Series 605: (id) · axial · 3.0mm · 1.28mm/px · 1 of 32 slices shown (2 of 3)]
[im 1/32]
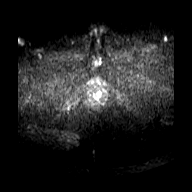

[Series 701: (id) · axial · 3.0mm · 1.28mm/px · 1 of 32 slices shown (3 of 3)]
[im 1/32]
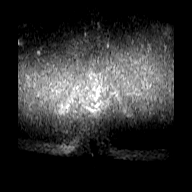

[Series 901: dyn 3mm*(ap) · axial · 3.0mm · 1.38mm/px · z∈[-83,+10]mm · 36 of 1440 slices shown]
[im 1/1440]
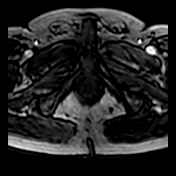
[im 42/1440]
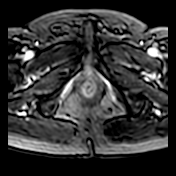
[im 83/1440]
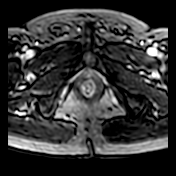
[im 124/1440]
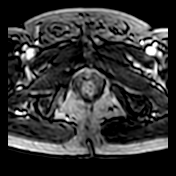
[im 165/1440]
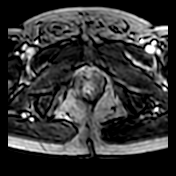
[im 206/1440]
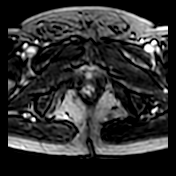
[im 247/1440]
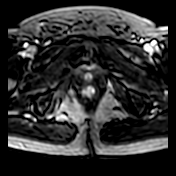
[im 288/1440]
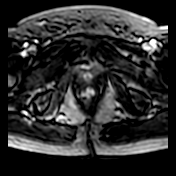
[im 329/1440]
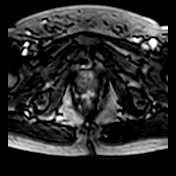
[im 371/1440]
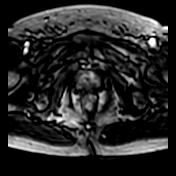
[im 412/1440]
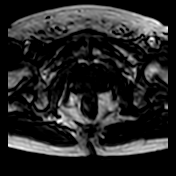
[im 453/1440]
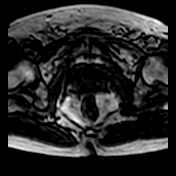
[im 494/1440]
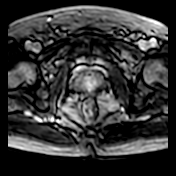
[im 535/1440]
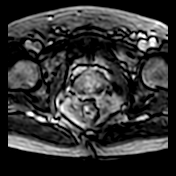
[im 576/1440]
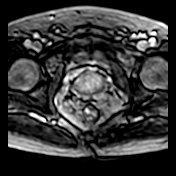
[im 617/1440]
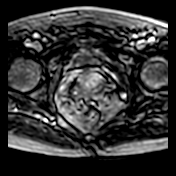
[im 658/1440]
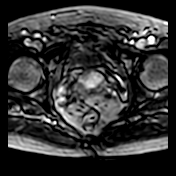
[im 699/1440]
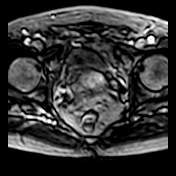
[im 741/1440]
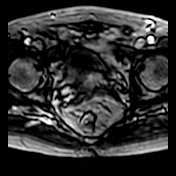
[im 782/1440]
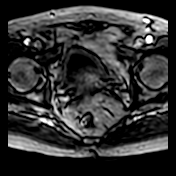
[im 823/1440]
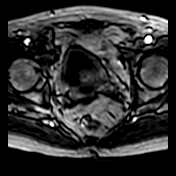
[im 864/1440]
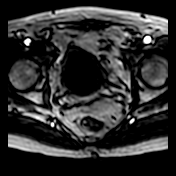
[im 905/1440]
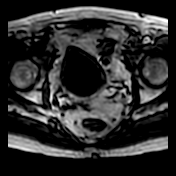
[im 946/1440]
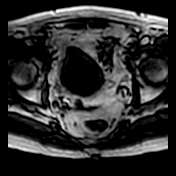
[im 987/1440]
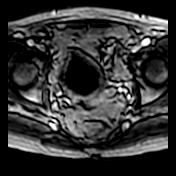
[im 1028/1440]
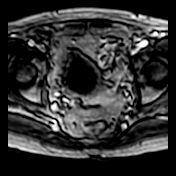
[im 1069/1440]
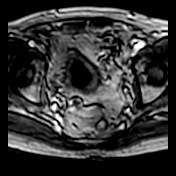
[im 1111/1440]
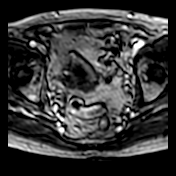
[im 1152/1440]
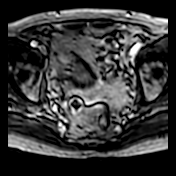
[im 1193/1440]
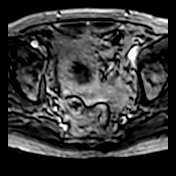
[im 1234/1440]
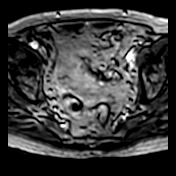
[im 1275/1440]
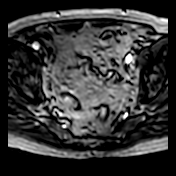
[im 1316/1440]
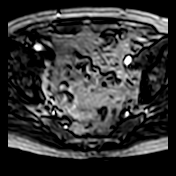
[im 1357/1440]
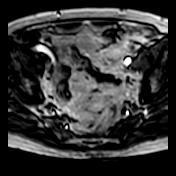
[im 1398/1440]
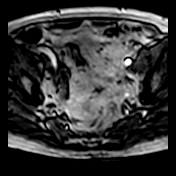
[im 1440/1440]
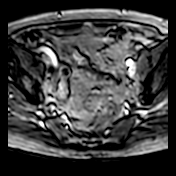

[Series 1002: DIXON · axial · 6.0mm · 0.49mm/px · 1 of 36 slices shown]
[im 1/36]
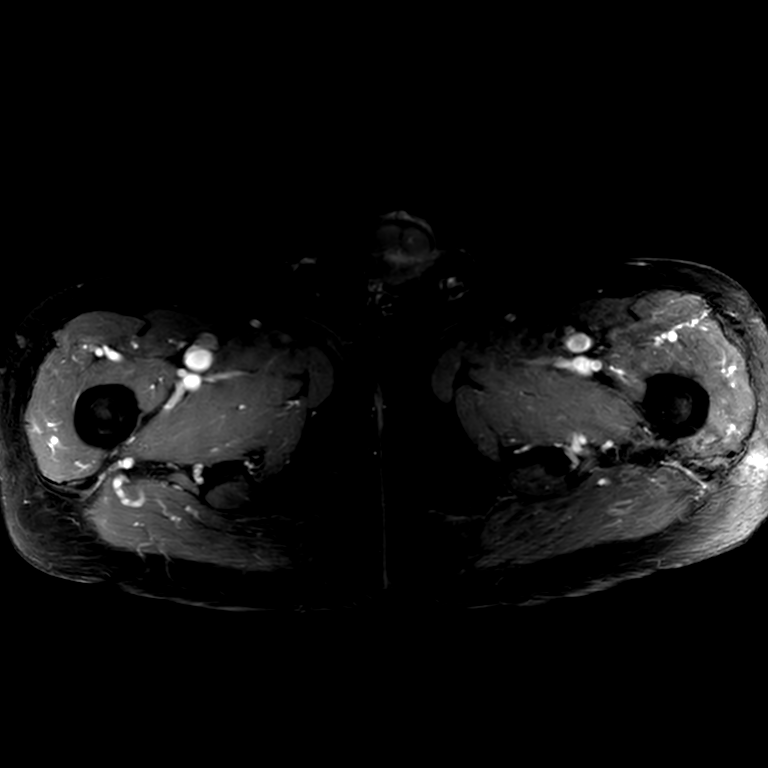

[48 of 48 positions shown; findings below may reference images not displayed]

FINDINGS: VOLUME: 36 cc 
CENTRAL GLAND: There is a 1.4 cm area of decreased T2 signal within the central 
gland anteriorly into the left. Mid gland level. Does show mild restricted 
diffusion. Appears to have relatively well-defined borders. Does however show 
significant washout. Central gland malignancy not excluded. 
PERIPHERAL ZONE: No areas of restricted diffusion seen within the peripheral 
zone. 
PROSTATE CAPSULE: Intact. 
MUSCLE SIDE WALLS: Normal in appearance. 
SEMINAL VESICLES: Normal in appearance. 
BLADDER: Thickened and trabeculated. Small diverticula identified. 
LYMPHADENOPATHY: No suspicious adenopathy identified. 
BONES: No osseous lesions identified. 
ADDITIONAL FINDINGS: Diverticulosis. Degenerative changes.
IMPRESSION: Concerning central gland lesion anteriorly to the left of midline mid gland 
level. 
(PI-RADS 4): High (clinically significant cancer is likely to be present).

## 2023-08-09 ENCOUNTER — Encounter (INDEPENDENT_AMBULATORY_CARE_PROVIDER_SITE_OTHER): Payer: Self-pay

## 2023-08-09 NOTE — Progress Notes (Signed)
[facility] Medicine [facility]   Outpatient Physical Therapy  [address]    [place], [place] [address]  Phone [phone]  FAX [phone]      OUTPATIENT PHYSICAL THERAPY DISCHARGE SUMMARY    GENERAL INFORMATION  Patient Name:Joe Young  Date of Birth: [birth year]  Physician: Lucretia Roers  Date of service: 08/09/23     PLAN OF CARE COUNTER END:  Physical Therapy Discharge     SUBJECTIVE  Patient reports feeling capable of managing with corrective exercise.        OBJECTIVE  Physical therapy visits:   4  Weeks of therapy:   4  Interventions:  manual therapy, neuromuscular re-education, therapeutic exercise     ASSESSMENT  Physician Diagnosis:  No diagnosis found.   Physical Therapist Diagnosis:  Dysfunctional painful and dysfunctional non-painful WB movement patterns.  Problems and Goals:  Problem:  No knowledge of self management of dysfunctional movement patterns.    Goal:  Demonstrating ability to self manage with corrective exercise.     PLAN:  Reason for discharge:  reached maximum benefit from physical therapy, and patient wants to stop physical therapy  Discharge plan:  continue home exercises     Fabian November, PT

## 2023-08-30 IMAGING — CT CT CHEST WITHOUT CONTRAST
2 of 4 series · 15 of 36 positions shown, 18 images · non-contrast
Comparison: 08/05/2022   
Count of known CT and Cardiac Nuclear Medicine studies performed in the previous 
12 months = 0.

________________________________________________________________________________________________ 
CT CHEST WITHOUT CONTRAST, 08/30/2023 [DATE]: 
CLINICAL INDICATION: Solitary pulmonary nodule 
A search for DICOM formatted images was conducted for prior CT imaging studies 
completed at a non-affiliated media free facility.
TECHNIQUE: The chest was scanned from base of neck through the lung bases 
without contrast on a high resolution low dose CT scanner. Routine MPR and MIP 
reconstruction images were performed.

[Series 2: chest 2.0 i31s 3 · axial · 0.82mm/px · z∈[-394,-40]mm · 12 of 195 slices shown, 15 images]
[im 9/195  mediastinal]
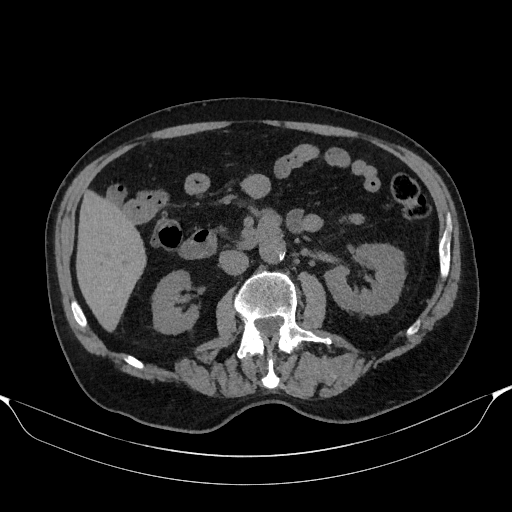
[im 9/195  lung]
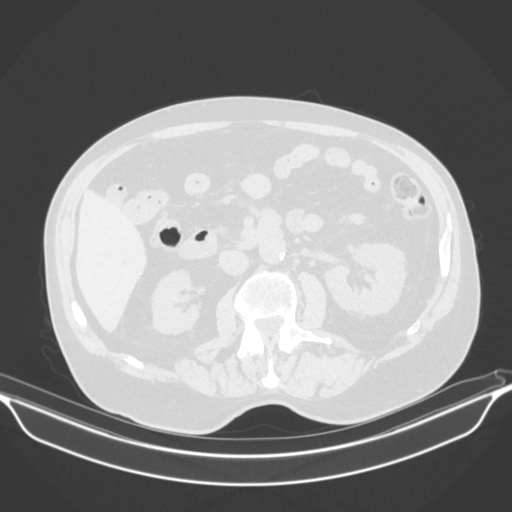
[im 27/195  lung]
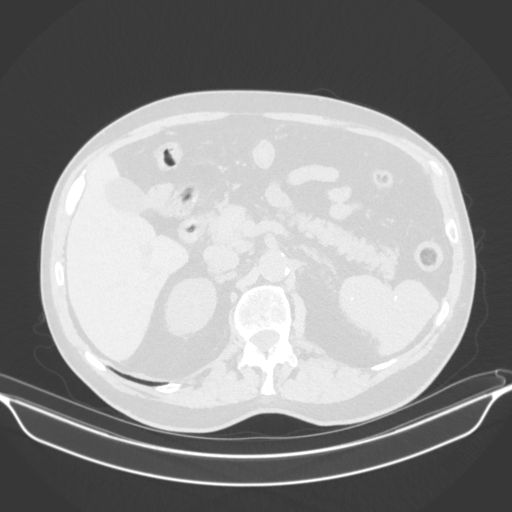
[im 45/195  lung]
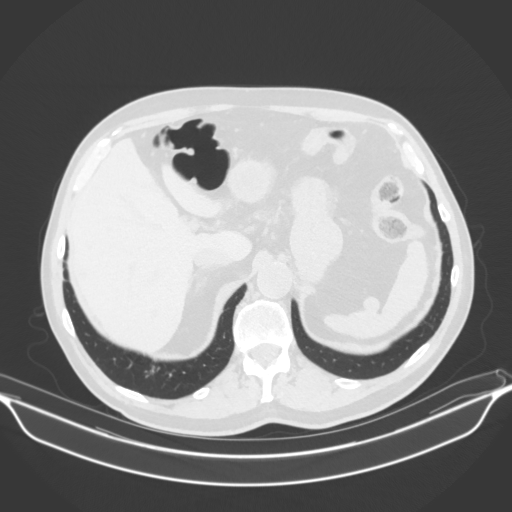
[im 62/195  lung]
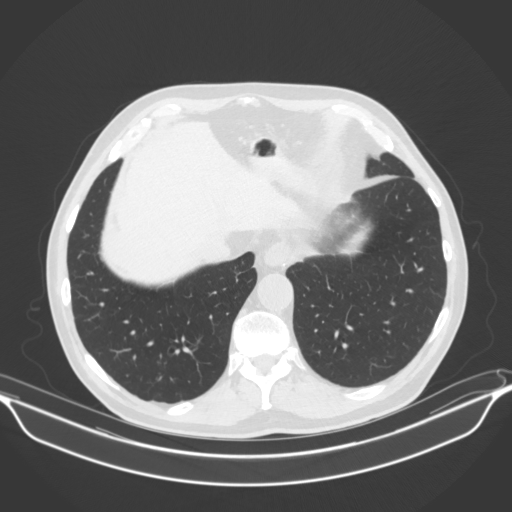
[im 71/195  mediastinal]
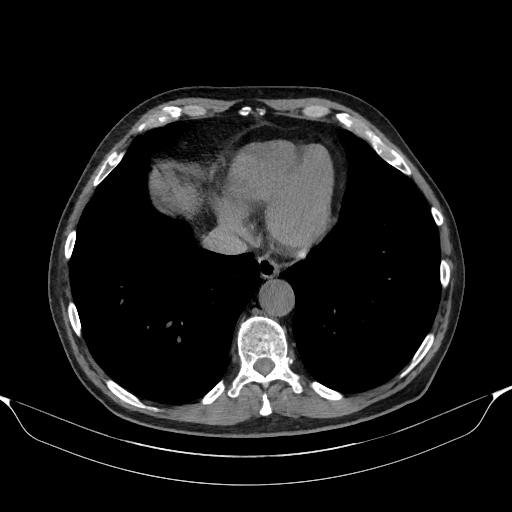
[im 71/195  lung]
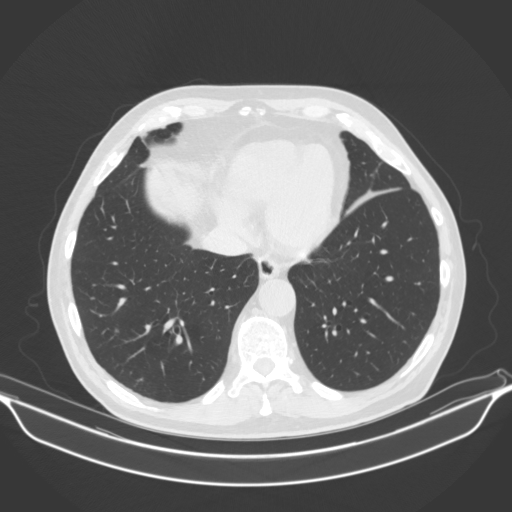
[im 89/195  lung]
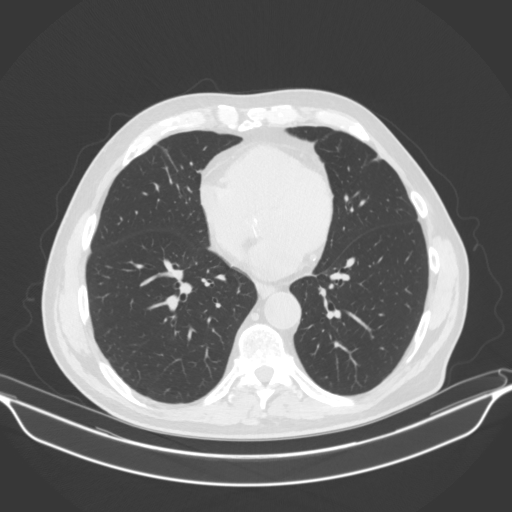
[im 106/195  lung]
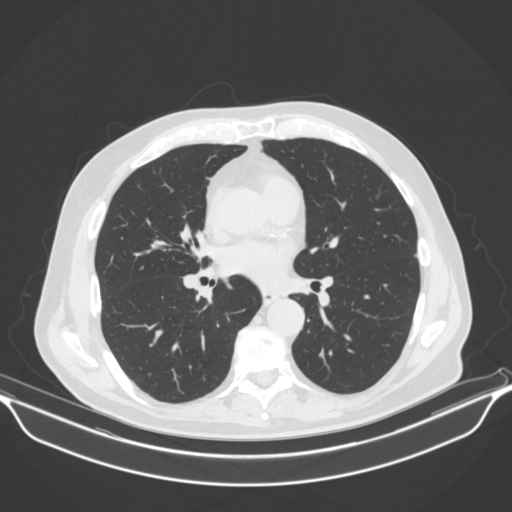
[im 124/195  lung]
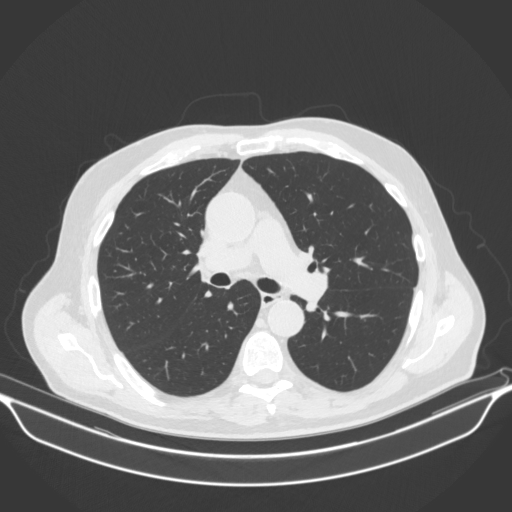
[im 133/195  mediastinal]
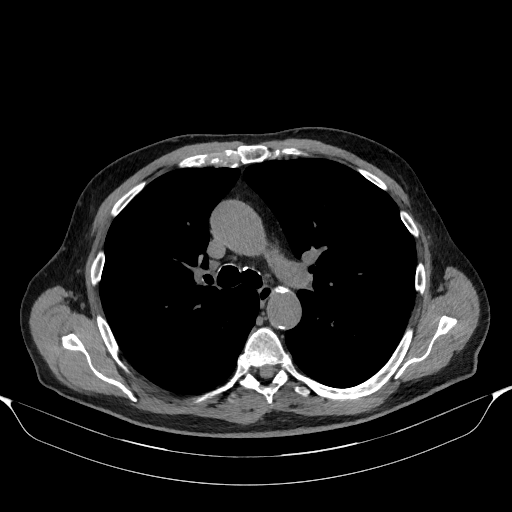
[im 133/195  lung]
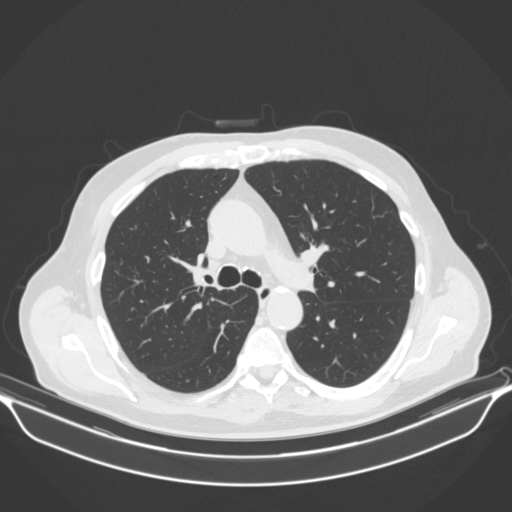
[im 150/195  lung]
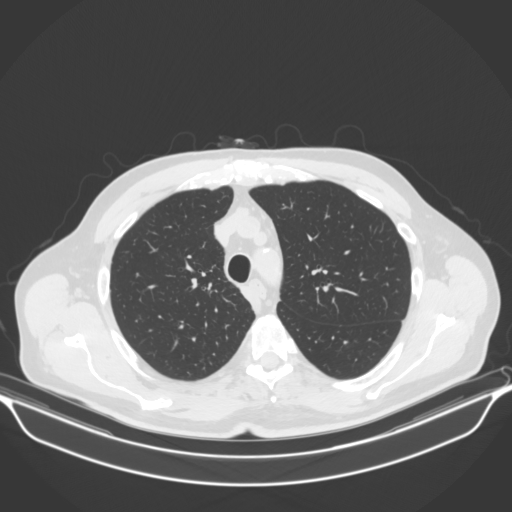
[im 168/195  lung]
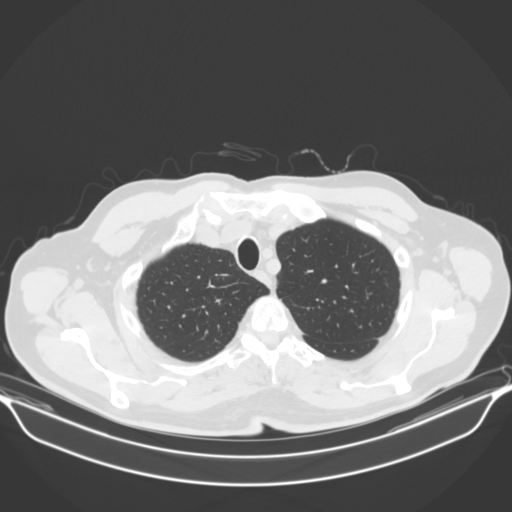
[im 186/195  lung]
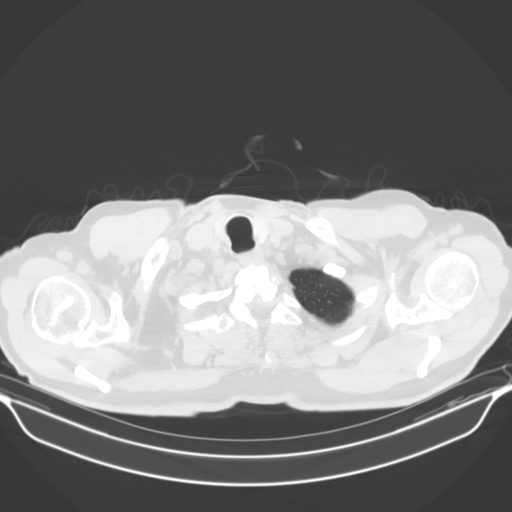

[Series 4: coronal · coronal · 0.81mm/px · 3 of 154 slices shown]
[im 31/154  lung]
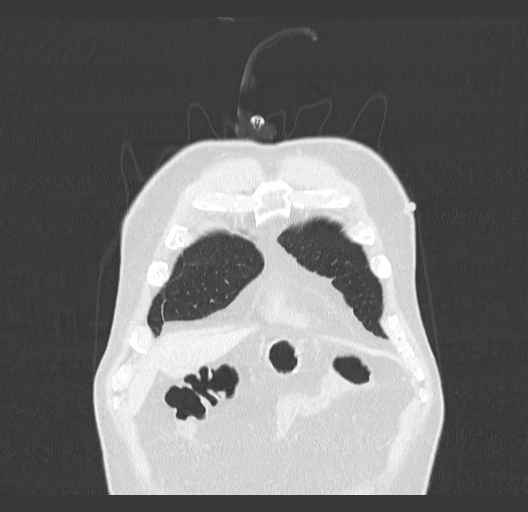
[im 62/154  lung]
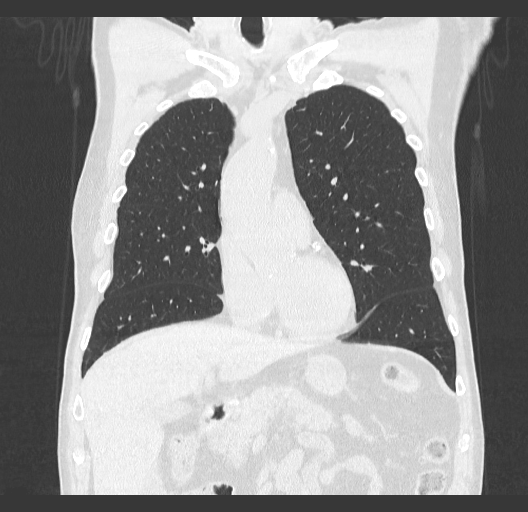
[im 92/154  lung]
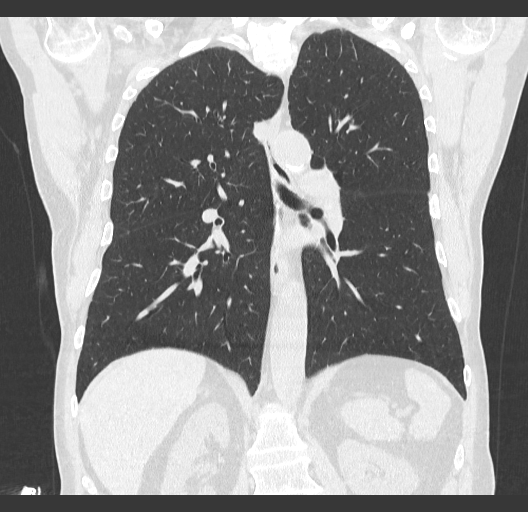

[15 of 36 positions shown; findings below may reference images not displayed]

FINDINGS: LUNGS AND PLEURA:  Stable micronodules again seen in the lungs largest in the 
right lower lobe measuring up to 3 mm on MIP image #25 of series #6. No pleural 
effusion.  
MEDIASTINUM:  No adenopathy. Normal heart size. No pericardial effusion. 
Moderate coronary artery calcifications noted. 
CHEST WALL/AXILLA: No mass or adenopathy.  
UPPER ABDOMEN: Negative. 
MUSCULOSKELETAL: No acute abnormality.
IMPRESSION: Stable micronodules again seen in the lungs largest in the right lower lobe 
measuring up to 3 mm on MIP image #25 of series #6. As these are stable for one 
year and are less than 6 mm these are felt to be benign. 
No consolidations or infiltrates in the lungs. 
Moderate coronary calcifications. 
REFERENCE: 
Recommendations for pulmonary nodule follow-up according to [HOSPITAL] 
Guidelines. 
Multiple nodules size: < 6mm 
*Low risk patients: no routine follow-up. 
*High risk patients: optional CT at 12 months. 
Multiple nodules size: 6-8mm 
*Low risk patients: follow-up at 3-6 months, then consider further follow-up at 
18-24 months. 
*High risk patients: follow-up at 3-6 months, then at 18-24 months if no change. 
Multiple nodules size: > 8mm 
*Low risk patients: follow-up at 3-6 months, then consider further follow-up at 
18-24 months. 
*High risk patients: follow-up at 3-6 months, then at 18-24 months if no change. 
NOTE: 
Low risk patients: minimal or absent history of smoking and or other known risk 
factors. 
High risk patients: history of smoking or of other known risk factors (e.g. 
first degree relative with lung cancer, or exposure to asbestos, radon uranium) 
If a nodule up to 8mm is partly solid or is ground glass further follow up is 
required after 24 months to exclude possible slow growing adenocarcinoma (MARC-ANDRE) 
Size is average of length and width. 
In patients between the ages of 50-77 where pulmonary emphysema is noted on CT, 
recommend evaluation for low dose lung cancer screening protocol if patient is 
not already enrolled; as pulmonary emphysema is an independent risk factor for 
lung cancer. 
RADIATION DOSE REDUCTION: All CT scans are performed using radiation dose 
reduction techniques, when applicable.  Technical factors are evaluated and 
adjusted to ensure appropriate moderation of exposure.  Automated dose 
management technology is applied to adjust the radiation doses to minimize 
exposure while achieving diagnostic quality images.

## 2024-07-03 ENCOUNTER — Other Ambulatory Visit (HOSPITAL_COMMUNITY): Payer: Self-pay | Admitting: FAMILY PRACTICE

## 2024-07-03 DIAGNOSIS — R911 Solitary pulmonary nodule: Secondary | ICD-10-CM
